# Patient Record
Sex: Female | Born: 1960 | Hispanic: No | Marital: Married | Smoking: Never smoker
Health system: Southern US, Community
[De-identification: ages and names within clinical notes are randomized; demographics above are authoritative.]

## PROBLEM LIST (undated history)

## (undated) DIAGNOSIS — C4492 Squamous cell carcinoma of skin, unspecified: Secondary | ICD-10-CM

## (undated) HISTORY — PX: KNEE ARTHROSCOPY W/ LASER: SHX1872

## (undated) HISTORY — DX: Squamous cell carcinoma of skin, unspecified: C44.92

---

## 1998-04-23 ENCOUNTER — Other Ambulatory Visit: Admission: RE | Admit: 1998-04-23 | Discharge: 1998-04-23 | Payer: Self-pay | Admitting: Obstetrics and Gynecology

## 2000-04-23 ENCOUNTER — Other Ambulatory Visit: Admission: RE | Admit: 2000-04-23 | Discharge: 2000-04-23 | Payer: Self-pay | Admitting: Obstetrics and Gynecology

## 2002-07-13 ENCOUNTER — Other Ambulatory Visit: Admission: RE | Admit: 2002-07-13 | Discharge: 2002-07-13 | Payer: Self-pay | Admitting: Obstetrics and Gynecology

## 2002-11-28 DIAGNOSIS — C4492 Squamous cell carcinoma of skin, unspecified: Secondary | ICD-10-CM

## 2002-11-28 HISTORY — DX: Squamous cell carcinoma of skin, unspecified: C44.92

## 2004-02-06 ENCOUNTER — Other Ambulatory Visit: Admission: RE | Admit: 2004-02-06 | Discharge: 2004-02-06 | Payer: Self-pay | Admitting: Obstetrics and Gynecology

## 2009-09-13 ENCOUNTER — Ambulatory Visit: Payer: Self-pay | Admitting: Family Medicine

## 2009-09-13 DIAGNOSIS — R519 Headache, unspecified: Secondary | ICD-10-CM | POA: Insufficient documentation

## 2009-09-13 DIAGNOSIS — J45901 Unspecified asthma with (acute) exacerbation: Secondary | ICD-10-CM | POA: Insufficient documentation

## 2009-09-13 DIAGNOSIS — R51 Headache: Secondary | ICD-10-CM

## 2009-09-13 DIAGNOSIS — J309 Allergic rhinitis, unspecified: Secondary | ICD-10-CM

## 2009-09-24 ENCOUNTER — Ambulatory Visit: Payer: Self-pay | Admitting: Family Medicine

## 2009-09-24 DIAGNOSIS — H6120 Impacted cerumen, unspecified ear: Secondary | ICD-10-CM | POA: Insufficient documentation

## 2009-10-03 ENCOUNTER — Telehealth: Payer: Self-pay | Admitting: Family Medicine

## 2009-10-15 ENCOUNTER — Ambulatory Visit: Payer: Self-pay | Admitting: Family Medicine

## 2009-10-15 DIAGNOSIS — R5383 Other fatigue: Secondary | ICD-10-CM

## 2009-10-15 DIAGNOSIS — R5381 Other malaise: Secondary | ICD-10-CM

## 2009-10-15 LAB — CONVERTED CEMR LAB
AST: 19 units/L (ref 0–37)
Albumin: 3.9 g/dL (ref 3.5–5.2)
Alkaline Phosphatase: 34 units/L — ABNORMAL LOW (ref 39–117)
Basophils Absolute: 0 10*3/uL (ref 0.0–0.1)
Bilirubin, Direct: 0.1 mg/dL (ref 0.0–0.3)
CO2: 29 meq/L (ref 19–32)
Calcium: 9.2 mg/dL (ref 8.4–10.5)
Chloride: 107 meq/L (ref 96–112)
Cholesterol: 174 mg/dL (ref 0–200)
Free T4: 1 ng/dL (ref 0.6–1.6)
GFR calc non Af Amer: 81.33 mL/min (ref >60)
Glucose, Bld: 70 mg/dL (ref 70–99)
HCT: 37.4 % (ref 36.0–46.0)
HDL: 39.3 mg/dL (ref >39.00)
Hemoglobin: 13 g/dL (ref 12.0–15.0)
MCHC: 34.8 g/dL (ref 30.0–36.0)
MCV: 93.4 fL (ref 78.0–100.0)
Monocytes Relative: 10.5 % (ref 3.0–12.0)
Neutrophils Relative %: 57.6 % (ref 43.0–77.0)
Potassium: 4.7 meq/L (ref 3.5–5.1)
RBC: 4.01 M/uL (ref 3.87–5.11)
RDW: 12.6 % (ref 11.5–14.6)
T3, Free: 2.8 pg/mL (ref 2.3–4.2)
Total Bilirubin: 0.4 mg/dL (ref 0.3–1.2)
VLDL: 20.4 mg/dL (ref 0.0–40.0)
WBC: 5.1 10*3/uL (ref 4.5–10.5)

## 2009-10-17 ENCOUNTER — Ambulatory Visit: Payer: Self-pay | Admitting: Family Medicine

## 2009-10-21 ENCOUNTER — Telehealth: Payer: Self-pay | Admitting: Family Medicine

## 2009-10-22 ENCOUNTER — Ambulatory Visit: Payer: Self-pay | Admitting: Family Medicine

## 2009-10-22 DIAGNOSIS — L259 Unspecified contact dermatitis, unspecified cause: Secondary | ICD-10-CM | POA: Insufficient documentation

## 2010-07-15 NOTE — Assessment & Plan Note (Signed)
Summary: rash/dm   Vital Signs:  Patient profile:   50 year old female Menstrual status:  regular BP sitting:   110 / 70  (right arm) Cuff size:   regular  Vitals Entered By: Kathrynn Speed CMA (Oct 22, 2009 3:32 PM) CC: Rash on neck   Contraindications/Deferment of Procedures/Staging:    Test/Procedure: Weight Refused    Reason for deferment: patient declined-cannot calculate BMI   CC:  Rash on neck.  History of Present Illness: Cindy Frost is a 50 year old female, married, who comes in today for evaluation of a skin rash on her neck and for 5 days.  It is nonpleuritic.  It just involves the neck pain.  She's had a history of unusual rashes in the past.  She states that last year.  She broke out in whelps all over.  No diagnosis.  Sounds like idiopathic urticaria  Allergies: 1)  ! Amoxicillin  Past History:  Past medical, surgical, family and social histories (including risk factors) reviewed for relevance to current acute and chronic problems.  Past Medical History: Reviewed history from 09/13/2009 and no changes required. Allergic rhinitis Headache  Past Surgical History: Reviewed history from 09/13/2009 and no changes required. Caesarean section Tonsillectomy mole removals BTL cb x 3  Family History: Reviewed history from 09/13/2009 and no changes required. Father: healthy Mother: dementia Siblings: 1 brother - healthy, 2 sisters - healthy Children: 3 girls - healthy  Social History: Reviewed history from 09/13/2009 and no changes required. 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   Problems:  Medical Problems Added: 1)  Dx of Contact Dermatitis&other Eczema Due Unspec Cause   (ICD-692.9)  Impression & Recommendations:  Problem # 1:  CONTACT DERMATITIS&OTHER ECZEMA DUE UNSPEC CAUSE (ICD-692.9) Assessment New  Her updated medication list for this problem includes:    Benadryl 25 Mg Tabs (Diphenhydramine hcl) .Marland Kitchen... Take one tab by mouth once daily  Complete Medication List: 1)  Benadryl 25 Mg Tabs (Diphenhydramine hcl) .... Take one tab by mouth once daily 2)  Flovent Hfa 44 Mcg/act Aero (Fluticasone propionate  hfa) .... 2 ps two times a day 3)  Hydromet 5-1.5 Mg/18ml Syrp (Hydrocodone-homatropine) .... 1/2 to 1 tsp at bedtime as needed  Patient Instructions: 1)  apply small amounts of Cortaid cream twice daily.  Return p.r.n.   Orders Added: 1)  Est. Patient Level III [[address]]

## 2010-07-15 NOTE — Assessment & Plan Note (Signed)
Summary: recheck ear/dm   Vital Signs:  Patient profile:   50 year old female Menstrual status:  regular Temp:     98.8 degrees F oral BP sitting:   110 / 78  (left arm) Cuff size:   regular  Vitals Entered By: Kern Reap CMA Duncan Dull) (September 24, 2009 12:27 PM)  History of Present Illness: Cindy Frost is a 50 year old, married female, nonsmoker, who comes in today for evaluation of two problems.  She has bilateral cerumen impactions, and has been using the earwax dissolving drops at home.  She tried flushing, but no wax came out.  We start her on prednisone two tabs x 3 days in April.  The first.  She states she is not a whole lot better.  She still coughing and wheezing.  Review of systems negative specifically, no history of reflux, except she states when she was pregnant  Allergies: 1)  ! Amoxicillin  Review of Systems      See HPI  Physical Exam  General:  Well-developed,well-nourished,in no acute distress; alert,appropriate and cooperative throughout examination Head:  Normocephalic and atraumatic without obvious abnormalities. No apparent alopecia or balding. Eyes:  No corneal or conjunctival inflammation noted. EOMI. Perrla. Funduscopic exam benign, without hemorrhages, exudates or papilledema. Vision grossly normal. Ears:  bilateral cerumen impactions removed by me with suction Nose:  External nasal examination shows no deformity or inflammation. Nasal mucosa are pink and moist without lesions or exudates. Mouth:  Oral mucosa and oropharynx without lesions or exudates.  Teeth in good repair. Neck:  No deformities, masses, or tenderness noted. Chest Wall:  No deformities, masses, or tenderness noted. Lungs:  symmetrical breath sounds, late expiratory wheezing   Impression & Recommendations:  Problem # 1:  ASTHMA, ACUTE (ICD-493.92) Assessment Improved  Her updated medication list for this problem includes:    Prednisone 20 Mg Tabs (Prednisone) ..... Uad  Problem # 2:   CERUMEN IMPACTION (ICD-380.4) Assessment: Unchanged  Orders: Cerumen Impaction Removal ([address])  Complete Medication List: 1)  Prednisone 20 Mg Tabs (Prednisone) .... Uad 2)  Hydromet 5-1.5 Mg/2ml Syrp (Hydrocodone-homatropine) .Marland Kitchen.. 1 or 2 tsps at bedtime as needed 3)  Tessalon Perles 100 Mg Caps (Benzonatate) .Marland Kitchen.. 1 or 2 qid as needed  Patient Instructions: 1)  continue to drink lots of water, vaporizer or humidifier in your bedroom at night, Tessalon one to two tabs 4 times a day during the day for cough, Hydromet, one or 2 teaspoons at bedtime, and increase the prednisone two tabs x 3 days, one x 3 days, a half x 3 days, then half a tablet Monday, Wednesday, Friday, for a two week taper.  Also take Prilosec OTC 20 mg b.i.d. return p.r.n. Prescriptions: HYDROMET 5-1.5 MG/5ML SYRP (HYDROCODONE-HOMATROPINE) 1 or 2 tsps at bedtime as needed  #8oz x 0   Entered and Authorized by:   Roderick Pee MD   Signed by:   Roderick Pee MD on 09/24/2009   Method used:   Print then Give to Patient   RxID:   [id number] TESSALON PERLES 100 MG CAPS (BENZONATATE) 1 or 2 qid as needed  #50 x 2   Entered and Authorized by:   Roderick Pee MD   Signed by:   Roderick Pee MD on 09/24/2009   Method used:   [facility]        [facility]  [address] [phone]ail[facility]       [address]  [place], [place]  [address]       Ph: [phone] or [phone]       Fax: [phone]   RxID:   [id number]

## 2010-07-15 NOTE — Assessment & Plan Note (Signed)
Summary: requesting labs//ccm   Vital Signs:  Patient profile:   50 year old female Menstrual status:  regular Weight:      144 pounds Temp:     98.7 degrees F oral BP sitting:   102 / 70  (left arm) Cuff size:   regular CC: cough & body aches X2 month   wants blood work   CC:  cough & body aches X2 month   wants blood work.  History of Present Illness: Carrin is a 50 year old, married female, nonsmoker, who comes in today with a two-month history of cough, fatigue, nausea, and 10 pounds of weight loss.  We saw her a couple weeks ago with head congestion, postnasal drip, and cough.  On physical exam, she was wheezing.  We put her on a burst and taper of prednisone.  He does seem to help with her 4 were repeated and the second, round, and help either.  She feels fatigued and short of breath, even when climbing stairs.  Review of systems otherwise negative.  LMP April 21.  Birth, control she's had a BTL  Allergies: 1)  ! Amoxicillin  Past History:  Past medical, surgical, family and social histories (including risk factors) reviewed, and no changes noted (except as noted below).  Past Medical History: Reviewed history from 09/13/2009 and no changes required. Allergic rhinitis Headache  Past Surgical History: Reviewed history from 09/13/2009 and no changes required. Caesarean section Tonsillectomy mole removals BTL cb x 3  Family History: Reviewed history from 09/13/2009 and no changes required. Father: healthy Mother: dementia Siblings: 1 brother - healthy, 2 sisters - healthy Children: 3 girls - healthy  Social History: Reviewed history from 09/13/2009 and no changes required. 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 No apparent alopecia or balding. Eyes:  No corneal or conjunctival inflammation noted. EOMI. Perrla. Funduscopic exam benign, without hemorrhages, exudates or papilledema. Vision grossly normal. Ears:  External ear exam shows no significant lesions or deformities.  Otoscopic examination reveals clear canals, tympanic membranes are intact bilaterally without bulging, retraction, inflammation or discharge. Hearing is grossly normal bilaterally. Nose:  External nasal examination shows no deformity or inflammation. Nasal mucosa are pink and moist without lesions or exudates. Mouth:  Oral mucosa and oropharynx without lesions or exudates.  Teeth in good repair. Neck:  No deformities, masses, or tenderness noted. Chest Wall:  No deformities, masses, or tenderness noted. Breasts:  No mass, nodules, thickening, tenderness, bulging, retraction, inflamation, nipple discharge or skin changes noted.   Lungs:  Normal respiratory effort, chest expands symmetrically. Lungs are clear to auscultation, no crackles or wheezes. Heart:  Normal rate and regular rhythm. S1 and S2 normal without gallop, murmur, click, rub or other extra sounds. Abdomen:  Bowel sounds positive,abdomen soft and non-tender without masses, organomegaly or hernias noted. Msk:  No deformity or scoliosis noted of thoracic or lumbar spine.   Pulses:  R and L carotid,radial,femoral,dorsalis pedis and posterior tibial pulses are full and equal bilaterally Extremities:  No clubbing, cyanosis, edema, or deformity noted with normal full range of motion of all joints.   Neurologic:  No cranial nerve deficits noted. Station and gait are normal. Plantar reflexes are down-going bilaterally. DTRs are symmetrical throughout. Sensory, motor and coordinative functions appear intact. Skin:  Intact without suspicious lesions or rashes Cervical Nodes:  No lymphadenopathy noted Axillary Nodes:  No palpable lymphadenopathy Inguinal  Nodes:  No significant adenopathy Psych:  Cognition and judgment appear intact. Alert and cooperative with normal attention span and concentration. No apparent delusions, illusions, hallucinations   Problems:  Medical Problems Added: 1)  Dx of Fatigue  (ICD-780.79) 2)  Dx of Nausea  (ICD-787.02) 3)  Dx of Cough  (ICD-786.2)  Impression & Recommendations:  Problem # 1:  FATIGUE (ICD-780.79) Assessment New  Orders: TLB-Lipid Panel (80061-LIPID) TLB-BMP (Basic Metabolic Panel-BMET) (80048-METABOL) TLB-CBC Platelet - w/Differential (85025-CBCD) TLB-Hepatic/Liver Function Pnl (80076-HEPATIC) TLB-TSH (Thyroid Stimulating Hormone) (84443-TSH) TLB-T3, Free (Triiodothyronine) (84481-T3FREE) TLB-T4 (Thyrox), Free (84439-FT4R) T-2 View CXR (71020TC)  Problem # 2:  NAUSEA (ICD-787.02) Assessment: New  Orders: TLB-Lipid Panel (80061-LIPID) TLB-BMP (Basic Metabolic Panel-BMET) (80048-METABOL) TLB-CBC Platelet - w/Differential (85025-CBCD) TLB-Hepatic/Liver Function Pnl (80076-HEPATIC) TLB-TSH (Thyroid Stimulating Hormone) (84443-TSH) TLB-T3, Free (Triiodothyronine) (84481-T3FREE) TLB-T4 (Thyrox), Free (84439-FT4R) T-2 View CXR (71020TC) T-Sinuses Complete (70220TC)  Problem # 3:  COUGH (ICD-786.2) Assessment: Unchanged  Orders: TLB-Lipid Panel (80061-LIPID) TLB-BMP (Basic Metabolic Panel-BMET) (80048-METABOL) TLB-CBC Platelet - w/Differential (85025-CBCD) TLB-Hepatic/Liver Function Pnl (80076-HEPATIC) TLB-TSH (Thyroid Stimulating Hormone) (84443-TSH) TLB-T3, Free (Triiodothyronine) (84481-T3FREE) TLB-T4 (Thyrox), Free ([address]-UE4V) T-2 View CXR (71020TC) T-Sinuses Complete (70220TC)  Other Orders: Venipuncture (40981)  Patient Instructions: 1)  we will do your lab work today and go to the main office now for your sinus x-rays and chest x-ray.  Return on Thursday for follow-up.

## 2010-07-15 NOTE — Assessment & Plan Note (Signed)
Summary: follow up office visit -rv   Vitals Entered By: Kern Reap CMA Duncan Dull) (Oct 17, 2009 4:09 PM) CC: follow-up visit   CC:  follow-up visit.  Allergies: 1)  ! Amoxicillin   Complete Medication List: 1)  Benadryl 25 Mg Tabs (Diphenhydramine hcl) .... Take one tab by mouth once daily 2)  Flovent Hfa 44 Mcg/act Aero (Fluticasone propionate  hfa) .... 2 ps two times a day 3)  Hydromet 5-1.5 Mg/72ml Syrp (Hydrocodone-homatropine) .... 1/2 to 1 tsp at bedtime as needed  Other Orders: Pulmonary Referral (Pulmonary)  Patient Instructions: 1)  drink 30 ounces of water daily, take one half to 1 teaspoon of Hydromet at bedtime. 2)  Begin Flovent 44 2+ b.i.d. remember swish and spit after using the Flovent. 3)  I will set showed a pulmonary consult for further evaluation AndAndIsPrescriptions: HYDROMET 5-1.5 MG/5ML SYRP (HYDROCODONE-HOMATROPINE) 1/2 to 1 tsp at bedtime as needed  #8oz x 1   Entered and Authorized by:   Roderick Pee MD   Signed by:   Roderick Pee MD on 10/17/2009   Method used:   Print then Give to Patient   RxID:   [id number] FLOVENT HFA 44 MCG/ACT AERO (FLUTICASONE PROPIONATE  HFA) 2 ps two times a day  #1 x 3   Entered and Authorized by:   Roderick Pee MD   Signed by:   Roderick Pee MD on 10/17/2009   Method used:   [facility]  [address] [address]       [address]       [place], [place]  [address]       Ph: [phone] or [phone]       Fax: [phone]   RxID:   [id number]

## 2010-07-15 NOTE — Progress Notes (Signed)
Summary: new rx  Phone Note Call from Patient Call back at [phone]   Caller: Patient Call For: Roderick Pee MD Summary of Call: pt would like another round of hydromet cough syrup. pt will pick up rx Initial call taken by: Heron Sabins,  October 03, 2009 12:58 PM  Follow-up for Phone Call        Hydromet 4 ounces directions one to 2 teaspoons at bedtime as needed for nighttime cough.  No refills.  If symptoms persist, will need ov ............    next week Follow-up by: Roderick Pee MD,  October 03, 2009 1:38 PM  Additional Follow-up for Phone Call Additional follow up Details #1::        Phone Call Completed, Rx Called In Additional Follow-up by: Kern Reap CMA Duncan Dull),  October 03, 2009 3:05 PM

## 2010-07-15 NOTE — Assessment & Plan Note (Signed)
Summary: NEW PT TO EST--OK PER RACHEL//CCM   Vital Signs:  Patient profile:   50 year old female Menstrual status:  regular LMP:     09/13/2009 Height:      67 inches Weight:      149 pounds BMI:     23.42 Temp:     98.5 degrees F oral BP sitting:   110 / 80  (left arm) Cuff size:   regular  Vitals Entered By: Kern Reap CMA Duncan Dull) (September 13, 2009 10:34 AM) CC: new to establish Is Patient Diabetic? No Pain Assessment Patient in pain? yes     Location: ear LMP (date): 09/13/2009     Menstrual Status regular Enter LMP: 09/13/2009   CC:  new to establish.  History of Present Illness: Cindy Frost is a 50 year old, married female, G3, P3, who works at [employer], who comes in today for new patient evaluation of a headache and cough x 3 weeks.  She's had head congestion, sore throat, postnasal drip, and a cough for 3 weeks.  She went to an urgent care center and they gave her a Z-Pak, but of course that did not help.  She also has pain in her left ear that she states is sharp and it comes and goes.  Review of systems otherwise negative.  She is a nonsmoker.  She's had an allergy workup in the past and was told she has severe allergies to tying and all the trees bloom in the spring.  This was done in [place].  Birth, control she's had a BTL  Preventive Screening-Counseling & Management  Alcohol-Tobacco     Smoking Status: never  Hep-HIV-STD-Contraception     Dental Visit-last 6 months yes      Drug Use:  no.    Allergies (verified): 1)  ! Amoxicillin  Past History:  Past Medical History: Allergic rhinitis Headache  Past Surgical History: Caesarean section Tonsillectomy mole removals BTL cb x 3  Family History: Reviewed history and no changes required. Father: healthy Mother: dementia Siblings: 1 brother - healthy, 2 sisters - healthy Children: 3 girls - healthy  Social History: Reviewed history and no changes required. 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 No apparent alopecia or balding. Eyes:  No corneal or conjunctival inflammation noted. EOMI. Perrla. Funduscopic exam benign, without hemorrhages, exudates or papilledema. Vision grossly normal. Ears:  I can visualize both eardrums.  They are full lax so.  TMs normal Nose:  External nasal examination shows no deformity or inflammation. Nasal mucosa are pink and moist without lesions or exudates. Mouth:  Oral mucosa and oropharynx without lesions or exudates.  Teeth in good repair. Neck:  No deformities, masses, or tenderness noted. Chest Wall:  No deformities, masses, or tenderness noted. Lungs:  symmetrical breath sounds, late expiratory wheezing   Problems:  Medical Problems Added: 1)  Dx of Asthma, Acute  (ICD-493.92) 2)  Dx of Headache  (ICD-784.0) 3)  Dx of Allergic Rhinitis  (ICD-477.9)  Impression & Recommendations:  Problem # 1:  ALLERGIC RHINITIS (ICD-477.9) Assessment New  Orders: Prescription Created Electronically [phone])  Problem # 2:  ASTHMA, ACUTE (GNF-621.30) Assessment: New  Her updated medication list  for this problem includes:    Prednisone 20 Mg Tabs (Prednisone) ..... Uad  Orders: Prescription Created Electronically [phone])  Complete Medication List: 1)  Prednisone 20 Mg Tabs (Prednisone) .... Uad 2)  Hydromet 5-1.5 Mg/16ml Syrp (Hydrocodone-homatropine) .Marland Kitchen.. 1 or 2 tsps at bedtime as needed  Patient Instructions: 1)  begin prednisone, take two tabs x 3 days, one x 3 days, a half x 3 days, then half a tablet Monday, Wednesday, Friday, for a two week taper. 2)  Use the ear wax kit as we  outlined..... if  the ear wax does not come out.  Call come in, we  will suck it out 3)  return for a annual medical exam, ASAP   v70.0 4)  BMP prior to visit, ICD-9: 5)  Hepatic Panel prior to visit, ICD-9: 6)  Lipid Panel prior to visit, ICD-9: 7)  TSH prior to visit, ICD-9: 8)  CBC w/ Diff prior to visit, ICD-9: 9)  Urine-dip prior to visit, ICD-9: Prescriptions: HYDROMET 5-1.5 MG/5ML SYRP (HYDROCODONE-HOMATROPINE) 1 or 2 tsps at bedtime as needed  #8oz x 0   Entered and Authorized by:   Roderick Pee MD   Signed by:   Roderick Pee MD on 09/13/2009   Method used:   Print then Give to Patient   RxID:   [id number] PREDNISONE 20 MG TABS (PREDNISONE) UAD  #30 x 1   Entered and Authorized by:   Roderick Pee MD   Signed by:   Roderick Pee MD on 09/13/2009   Method used:   [facility]        [facility]  [address]       [address]       [place], [place]  [address]       Ph: [phone] or [phone]       Fax: [phone]   RxID:   [id number]

## 2010-07-15 NOTE — Progress Notes (Signed)
Summary: rash on neck  Phone Note Call from Patient   Caller: Patient Call For: Roderick Pee MD Summary of Call: Pt would like to talk to Dr. Tawanna Cooler about a rash that developed on her neck since her office visit 10/17/2009. [phone] Initial call taken by: Lynann Beaver CMA,  Oct 21, 2009 9:34 AM  Follow-up for Phone Call        I called twice no answer.  Left message on answering machine to come to the office today at 445 because not  possible to diagnose a skin rash over the phone Follow-up by: Roderick Pee MD,  Oct 21, 2009 3:27 PM     Appended Document: rash on neck [phone] wrong number, pt will come for OV today.

## 2017-04-01 DIAGNOSIS — Z1231 Encounter for screening mammogram for malignant neoplasm of breast: Secondary | ICD-10-CM | POA: Diagnosis not present

## 2017-04-01 DIAGNOSIS — Z6824 Body mass index (BMI) 24.0-24.9, adult: Secondary | ICD-10-CM | POA: Diagnosis not present

## 2017-04-01 DIAGNOSIS — Z01419 Encounter for gynecological examination (general) (routine) without abnormal findings: Secondary | ICD-10-CM | POA: Diagnosis not present

## 2017-05-31 ENCOUNTER — Telehealth: Payer: Self-pay | Admitting: Family Medicine

## 2017-05-31 NOTE — Telephone Encounter (Signed)
Copied from [place]. Topic: Appointment Scheduling - Scheduling Inquiry for Clinic >> May 31, 2017  9:14 AM Patrice Paradise wrote: Reason for CRM: Patient called to schedule an appt but has not been seen in the practice over 3 years. She is a formal patient of Dr. Sherren Mocha. I informed the patient that I would have to put her in as a new patient and that Dr. Sherren Mocha was not excepting new patient. Patient stated that she really like him and would really like to have him as her doctor. Patient would like to know if Dr. Sherren Mocha would be will to take her as a new patient. Patient is requesting a call back @ [phone]

## 2017-05-31 NOTE — Telephone Encounter (Signed)
Attempted to call pt but the voicemail is not set up to leave a message. Will try back

## 2017-06-01 NOTE — Telephone Encounter (Signed)
Spoke with pt scheduled an appointment on 07/05/2017 at 9.45 am

## 2017-07-05 ENCOUNTER — Ambulatory Visit (INDEPENDENT_AMBULATORY_CARE_PROVIDER_SITE_OTHER): Payer: Managed Care, Other (non HMO)

## 2017-07-05 ENCOUNTER — Ambulatory Visit: Payer: Managed Care, Other (non HMO) | Admitting: Family Medicine

## 2017-07-05 ENCOUNTER — Encounter: Payer: Self-pay | Admitting: Family Medicine

## 2017-07-05 VITALS — BP 98/78 | HR 66 | Temp 97.8°F | Wt 154.0 lb

## 2017-07-05 DIAGNOSIS — Z23 Encounter for immunization: Secondary | ICD-10-CM

## 2017-07-05 DIAGNOSIS — J301 Allergic rhinitis due to pollen: Secondary | ICD-10-CM

## 2017-07-05 DIAGNOSIS — H6121 Impacted cerumen, right ear: Secondary | ICD-10-CM

## 2017-07-05 MED ORDER — PREDNISONE 20 MG PO TABS
ORAL_TABLET | ORAL | 1 refills | Status: DC
Start: 2017-07-05 — End: 2018-10-13

## 2017-07-05 NOTE — Progress Notes (Signed)
Cindy Frost is a delightful 57 year old female nonsmoker who comes in today for evaluation of a cough for your years  She's had a history of a cough for years. Is not related to eating or swallowing. She does not cough a lot at night. She does have a lot of head congestion. She went to wake Cindy Frost in [place] 01/11/2017 for a physical. The time she was given albuterol to take when necessary  She was also given a tetanus booster and given instructions on the shingles vaccine  She gets routine eye care, dental care, BSE monthly, annual mammography at the GYN office. Colonoscopy 2015 normal.  Social history....... the same Cindy Frost recently retired from [employer]. Wants to work it [facility] cuddly baby's.  She sees a GYN yearly for Paps and pelvics  BP 98/78 (BP Location: Left Arm, Patient Position: Sitting, Cuff Size: Normal)   Pulse 66   Temp 97.8 F (36.6 C) (Oral)   Wt 154 lb (69.9 kg)   BMI 24.12 kg/m  Well-developed well-nourished no acute distress  HEENT were pertinent the septum was deviated slightly to the left and she has a lot of swelling of the turbinates. She does have postnasal drip on oral exam lungs are clear to auscultation. Neck exam normal.  #1 allergic rhinitis with cough......... daily at bedtime antihistamine along with one shot of steroid nasal spray up each nostril at bedtime. If this does not resolve her symptoms we'll give her a short course of prednisone.  Chest x-ray per patient request

## 2017-07-05 NOTE — Patient Instructions (Signed)
Plain Zyrtec...Marland KitchenMarland KitchenMarland Kitchen 1 at bedtime  Steroid nasal spray.Marland KitchenMarland KitchenMarland KitchenMarland Kitchen 1 shot up each nostril at bedtime  If you do the above and you don't see much improvement in the next week to 10 days take a short course of prednisone........ 20 mg..........Marland Kitchen 1 tab for 7 days, half a tab for 7 days, then a half a tab Monday Wednesday Friday for a three-week taper  Go to the [place] office for your chest x-ray........... I will call you if there is anything abnormal

## 2018-04-25 ENCOUNTER — Other Ambulatory Visit: Payer: Self-pay | Admitting: Obstetrics and Gynecology

## 2018-04-25 DIAGNOSIS — R928 Other abnormal and inconclusive findings on diagnostic imaging of breast: Secondary | ICD-10-CM

## 2018-04-28 ENCOUNTER — Ambulatory Visit
Admission: RE | Admit: 2018-04-28 | Discharge: 2018-04-28 | Disposition: A | Discharge status: Home / Self Care | Payer: Managed Care, Other (non HMO) | Source: Ambulatory Visit | Attending: Obstetrics and Gynecology | Admitting: Obstetrics and Gynecology

## 2018-04-28 DIAGNOSIS — R928 Other abnormal and inconclusive findings on diagnostic imaging of breast: Secondary | ICD-10-CM

## 2018-10-13 ENCOUNTER — Other Ambulatory Visit: Payer: Self-pay

## 2018-10-13 ENCOUNTER — Ambulatory Visit (INDEPENDENT_AMBULATORY_CARE_PROVIDER_SITE_OTHER): Payer: BLUE CROSS/BLUE SHIELD | Admitting: Internal Medicine

## 2018-10-13 DIAGNOSIS — R6889 Other general symptoms and signs: Secondary | ICD-10-CM | POA: Diagnosis not present

## 2018-10-13 NOTE — Progress Notes (Signed)
Virtual Visit via Video Note  I connected with Cindy Frost on 10/13/18 at 10:30 AM EDT by a video enabled telemedicine application and verified that I am speaking with the correct person using two identifiers.  Location patient: home Location provider: work office Persons participating in the virtual visit: patient, provider  I discussed the limitations of evaluation and management by telemedicine and the availability of in person appointments. The patient expressed understanding and agreed to proceed.   HPI: This is an acute visit to discuss URI symptoms.  Since last night: Fever up to 101.6, chills, body aches, chronic cough. No SOB, no HA, no GI symptoms. Has been taking tylenol with some improvement. Temp 99.1 this am without tylenol use past 6 hours. Not working. Has only been leaving the house for groceries.   ROS: Constitutional: Denies fever, chills, diaphoresis, appetite change and fatigue.  HEENT: Denies photophobia, eye pain, redness, hearing loss, ear pain, mouth sores, trouble swallowing, neck pain, neck stiffness and tinnitus.   Respiratory: Denies SOB, DOE, chest tightness,  and wheezing.   Cardiovascular: Denies chest pain, palpitations and leg swelling.  Gastrointestinal: Denies nausea, vomiting, abdominal pain, diarrhea, constipation, blood in stool and abdominal distention.  Genitourinary: Denies dysuria, urgency, frequency, hematuria, flank pain and difficulty urinating.  Endocrine: Denies: hot or cold intolerance, sweats, changes in hair or nails, polyuria, polydipsia. Musculoskeletal: Denies myalgias, back pain, joint swelling, arthralgias and gait problem.  Skin: Denies pallor, rash and wound.  Neurological: Denies dizziness, seizures, syncope, weakness, light-headedness, numbness and headaches.  Hematological: Denies adenopathy. Easy bruising, personal or family bleeding history  Psychiatric/Behavioral: Denies suicidal ideation, mood changes, confusion,  nervousness, sleep disturbance and agitation   Past Medical History:  Diagnosis Date   SCC (squamous cell carcinoma) 11/28/2002   Left Side of Neck    Past Surgical History:  Procedure Laterality Date   KNEE ARTHROSCOPY W/ LASER Left     No family history on file.  SOCIAL HX:   reports that she has never smoked. She has never used smokeless tobacco. She reports current alcohol use. She reports that she does not use drugs.  No current outpatient medications on file.  EXAM:   VITALS per patient if applicable: none reported  GENERAL: alert, oriented, appears acutely ill, lying on couch covered in blankets  HEENT: atraumatic, conjunttiva clear, no obvious abnormalities on inspection of external nose and ears  NECK: normal movements of the head and neck  LUNGS: on inspection no signs of respiratory distress, breathing rate appears normal, no obvious gross increased work of breathing, gasping or wheezing  CV: no obvious cyanosis  MS: moves all visible extremities without noticeable abnormality  PSYCH/NEURO: pleasant and cooperative, no obvious depression or anxiety, speech and thought processing grossly intact  ASSESSMENT AND PLAN:   Flu-like symptoms -Sounds like flu vs COVID. -Symptoms are currently mild. -Advised self-isolation at home, rest, fluids and continued tylenol use. -Advised ER if develops SOB, otherwise will stay at home. -Will schedule another visit next week to see how she is doing.    I discussed the assessment and treatment plan with the patient. The patient was provided an opportunity to ask questions and all were answered. The patient agreed with the plan and demonstrated an understanding of the instructions.   The patient was advised to call back or seek an in-person evaluation if the symptoms worsen or if the condition fails to improve as anticipated.    [name], MD  [facility]  at [facility]

## 2019-02-14 ENCOUNTER — Other Ambulatory Visit: Payer: Self-pay

## 2019-02-14 ENCOUNTER — Ambulatory Visit (INDEPENDENT_AMBULATORY_CARE_PROVIDER_SITE_OTHER): Payer: BC Managed Care – PPO | Admitting: Internal Medicine

## 2019-02-14 ENCOUNTER — Encounter: Payer: Self-pay | Admitting: Internal Medicine

## 2019-02-14 VITALS — BP 120/80 | HR 79 | Temp 97.9°F | Wt 154.9 lb

## 2019-02-14 DIAGNOSIS — M545 Low back pain, unspecified: Secondary | ICD-10-CM

## 2019-02-14 LAB — POCT URINALYSIS DIPSTICK
Bilirubin, UA: NEGATIVE
Blood, UA: NEGATIVE
Glucose, UA: NEGATIVE
Ketones, UA: NEGATIVE
Leukocytes, UA: NEGATIVE
Nitrite, UA: NEGATIVE
Protein, UA: NEGATIVE
Spec Grav, UA: 1.015 (ref 1.010–1.025)
Urobilinogen, UA: 0.2 E.U./dL
pH, UA: 6.5 (ref 5.0–8.0)

## 2019-02-14 LAB — URINALYSIS, ROUTINE W REFLEX MICROSCOPIC
Bilirubin Urine: NEGATIVE
Hgb urine dipstick: NEGATIVE
Ketones, ur: NEGATIVE
Nitrite: NEGATIVE
RBC / HPF: NONE SEEN (ref 0–?)
Specific Gravity, Urine: 1.01 (ref 1.000–1.030)
Total Protein, Urine: NEGATIVE
Urine Glucose: NEGATIVE
Urobilinogen, UA: 0.2 (ref 0.0–1.0)
pH: 6.5 (ref 5.0–8.0)

## 2019-02-14 NOTE — Progress Notes (Signed)
     Acute Office Visit     CC/Reason for Visit: Left low back pain  HPI: Cindy Frost is a 58 y.o. female who is coming in today for the above mentioned reasons.  She has started experiencing some left lower back pain for the past 2 weeks.  She recently started running.  She denies dysuria, urinary frequency, gross hematuria or foul odor of urine.  She states pain is especially prominent at nighttime while moving in bed.  No radiculopathy.  No fevers.   Past Medical/Surgical History: Past Medical History:  Diagnosis Date  . SCC (squamous cell carcinoma) 11/28/2002   Left Side of Neck    Past Surgical History:  Procedure Laterality Date  . KNEE ARTHROSCOPY W/ LASER Left     Social History:  reports that she has never smoked. She has never used smokeless tobacco. She reports current alcohol use. She reports that she does not use drugs.  Allergies: Allergies  Allergen Reactions  . Amoxicillin     REACTION: hives    Family History:  No history of heart disease, cancer, stroke that she is aware of.  No current outpatient medications on file.  Review of Systems:  Constitutional: Denies fever, chills, diaphoresis, appetite change and fatigue.  HEENT: Denies photophobia, eye pain, redness, hearing loss, ear pain, congestion, sore throat, rhinorrhea, sneezing, mouth sores, trouble swallowing, neck pain, neck stiffness and tinnitus.   Respiratory: Denies SOB, DOE, cough, chest tightness,  and wheezing.   Cardiovascular: Denies chest pain, palpitations and leg swelling.  Gastrointestinal: Denies nausea, vomiting, abdominal pain, diarrhea, constipation, blood in stool and abdominal distention.  Genitourinary: Denies dysuria, urgency, frequency, hematuria, flank pain and difficulty urinating.  Endocrine: Denies: hot or cold intolerance, sweats, changes in hair or nails, polyuria, polydipsia. Musculoskeletal: Denies  joint swelling, arthralgias and gait problem.  Skin: Denies pallor,  rash and wound.  Neurological: Denies dizziness, seizures, syncope, weakness, light-headedness, numbness and headaches.  Hematological: Denies adenopathy. Easy bruising, personal or family bleeding history  Psychiatric/Behavioral: Denies suicidal ideation, mood changes, confusion, nervousness, sleep disturbance and agitation    Physical Exam: Vitals:   02/14/19 0926  BP: 120/80  Pulse: 79  Temp: 97.9 F (36.6 C)  TempSrc: Temporal  SpO2: 97%  Weight: 154 lb 14.4 oz (70.3 kg)    Body mass index is 24.26 kg/m.   Constitutional: NAD, calm, comfortable Eyes: PERRL, lids and conjunctivae normal ENMT: Mucous membranes are moist.  Musculoskeletal: no clubbing / cyanosis. No joint deformity upper and lower extremities. Good ROM, no contractures. Normal muscle tone.  No pain to palpation of left or midline back. Skin: no rashes, lesions, ulcers. No induration Neurologic: Grossly intact and nonfocal  psychiatric: Normal judgment and insight. Alert and oriented x 3. Normal mood.    Impression and Plan:  Acute left-sided low back pain without sciatica -Check UA and culture to rule out UTI/hematuria that might be indicative of kidney stones. -Have advised icing, ibuprofen, back stretches, local massage therapy. -She will return in 2 to 3 weeks.    Patient Instructions  -Nice seeing you today!!  -Will call you with the results of your urine tests once available.  -Ice 2-3 times a day for 15 mins. Do back stretches twice daily.  -Schedule followup in 3 weeks if no improvement.     [name], MD [facility] at [facility]

## 2019-02-14 NOTE — Addendum Note (Signed)
Addended by: Elmer Picker on: 02/14/2019 03:04 PM   Modules accepted: Orders

## 2019-02-14 NOTE — Addendum Note (Signed)
Addended by: Westley Hummer B on: 02/14/2019 10:21 AM   Modules accepted: Orders

## 2019-02-14 NOTE — Patient Instructions (Addendum)
-  Nice seeing you today!!  -Will call you with the results of your urine tests once available.  -Ice 2-3 times a day for 15 mins. Do back stretches twice daily. Ibuprofen 2 tablets twice daily for 5-7 days if needed for pain relief. Massage therapy could be helpful.  -Schedule follow up in 3 weeks if no improvement.  -Schedule follow up at your convenience for a physical. Please come in fasting that day for lab work.

## 2019-02-16 LAB — URINE CULTURE
MICRO NUMBER:: 835396
Result:: NO GROWTH
SPECIMEN QUALITY:: ADEQUATE

## 2019-02-27 ENCOUNTER — Telehealth: Payer: Self-pay | Admitting: *Deleted

## 2019-02-27 DIAGNOSIS — R300 Dysuria: Secondary | ICD-10-CM

## 2019-02-27 NOTE — Telephone Encounter (Signed)
Patient called after hours line 02/25/2019. Patient c/ot she started having some pain and burning with urination a couple of hours ago. She feels like she has to go but then she doesn't go. She complains of constant pressure. She denies any fever, nausea, vomiting or diarrhea.

## 2019-02-28 ENCOUNTER — Telehealth: Payer: Self-pay | Admitting: *Deleted

## 2019-02-28 NOTE — Telephone Encounter (Signed)
Copied from [place] [id number]. Topic: General - Other >> Feb 28, 2019  1:12 PM Wynetta Emery, [place] C wrote: Reason for CRM: pt called in to be advised. Pt says that she had a urinalysis completed and is now having the same symptoms again. Pt would like to have another urinalysis completed. Pt says that the first one didn't show anything but she feels that this one will.    [id number] -- please assist.

## 2019-02-28 NOTE — Telephone Encounter (Signed)
Spoke with patient.  She is having burning and some low back pain.  She would like to know if she can drop off a urine sample?  Patient was seen 02/14/2019.

## 2019-03-01 ENCOUNTER — Other Ambulatory Visit (INDEPENDENT_AMBULATORY_CARE_PROVIDER_SITE_OTHER): Payer: BC Managed Care – PPO

## 2019-03-01 ENCOUNTER — Other Ambulatory Visit: Payer: Self-pay

## 2019-03-01 ENCOUNTER — Other Ambulatory Visit: Payer: Self-pay | Admitting: Internal Medicine

## 2019-03-01 DIAGNOSIS — R300 Dysuria: Secondary | ICD-10-CM | POA: Diagnosis not present

## 2019-03-01 DIAGNOSIS — N3000 Acute cystitis without hematuria: Secondary | ICD-10-CM

## 2019-03-01 LAB — URINALYSIS, ROUTINE W REFLEX MICROSCOPIC
Bilirubin Urine: NEGATIVE
Hgb urine dipstick: NEGATIVE
Ketones, ur: NEGATIVE
Nitrite: NEGATIVE
Specific Gravity, Urine: 1.015 (ref 1.000–1.030)
Total Protein, Urine: NEGATIVE
Urine Glucose: NEGATIVE
Urobilinogen, UA: 0.2 (ref 0.0–1.0)
pH: 8 (ref 5.0–8.0)

## 2019-03-01 MED ORDER — SULFAMETHOXAZOLE-TRIMETHOPRIM 800-160 MG PO TABS: 1.0000 | ORAL_TABLET | Freq: Two times a day (BID) | ORAL | 0 refills | Status: AC

## 2019-03-01 NOTE — Telephone Encounter (Signed)
Spoke with patient Lab order placed Lab appointment made

## 2019-03-01 NOTE — Telephone Encounter (Signed)
Yes

## 2019-03-08 ENCOUNTER — Ambulatory Visit (INDEPENDENT_AMBULATORY_CARE_PROVIDER_SITE_OTHER): Payer: BC Managed Care – PPO | Admitting: Family Medicine

## 2019-03-08 ENCOUNTER — Other Ambulatory Visit: Payer: Self-pay

## 2019-03-08 ENCOUNTER — Encounter: Payer: Self-pay | Admitting: Family Medicine

## 2019-03-08 VITALS — BP 112/80 | HR 58 | Temp 97.7°F | Ht 66.5 in | Wt 156.6 lb

## 2019-03-08 DIAGNOSIS — R21 Rash and other nonspecific skin eruption: Secondary | ICD-10-CM

## 2019-03-08 DIAGNOSIS — M545 Low back pain, unspecified: Secondary | ICD-10-CM

## 2019-03-08 LAB — POCT URINALYSIS DIPSTICK
Bilirubin, UA: NEGATIVE
Blood, UA: NEGATIVE
Glucose, UA: NEGATIVE
Ketones, UA: NEGATIVE
Leukocytes, UA: NEGATIVE
Nitrite, UA: NEGATIVE
Protein, UA: NEGATIVE
Spec Grav, UA: 1.015 (ref 1.010–1.025)
Urobilinogen, UA: 0.2 E.U./dL
pH, UA: 6 (ref 5.0–8.0)

## 2019-03-08 MED ORDER — TRIAMCINOLONE ACETONIDE 0.1 % EX CREA: 1.0000 "application " | TOPICAL_CREAM | Freq: Two times a day (BID) | CUTANEOUS | 1 refills | Status: AC

## 2019-03-08 NOTE — Patient Instructions (Signed)
Follow up for any fever or other concerns  Try the steroid cream twice daily as needed.    Let me know if rash not cleared in 2 weeks.

## 2019-03-08 NOTE — Progress Notes (Signed)
  Subjective:     Patient ID: Cindy Frost, female   DOB: Nov 02, 1960, 58 y.o.   MRN: [id number]  HPI   Patient is seen as a work in with bilateral low back pain for about 7 weeks now.  This was felt to be musculoskeletal at her initial visit.  She had urine culture done early September which came back negative.  She then came back in and repeat urinalysis done on the 16th that showed significant leukocytes.  She was treated empirically with Cipro.  She states her back pain did improve slightly.  Culture was not done the second time.    She has not had significant hematuria on microscopy with either urine.  No history of kidney stones.  Denies burning with urination.  She has dull pain somewhat left flank greater than right.  Occasionally worse with movement.  No radiculitis symptoms.  Denies any fevers or chills.  No night sweats.  No gross hematuria.  No abdominal pain.  No appetite or weight changes  Second issue is she states she has rash on her upper and lower extremities bilaterally.  These are slightly vesicular.  Not aware of any contact allergies.  Moderate pruritus.  No relief with over-the-counter medications.  Past Medical History:  Diagnosis Date  . SCC (squamous cell carcinoma) 11/28/2002   Left Side of Neck   Past Surgical History:  Procedure Laterality Date  . KNEE ARTHROSCOPY W/ LASER Left     reports that she has never smoked. She has never used smokeless tobacco. She reports current alcohol use. She reports that she does not use drugs. family history is not on file. Allergies  Allergen Reactions  . Amoxicillin     REACTION: hives     Review of Systems  Constitutional: Negative for appetite change, chills, fever and unexpected weight change.  Respiratory: Negative for shortness of breath.   Cardiovascular: Negative for chest pain.  Gastrointestinal: Negative for abdominal pain.  Genitourinary: Positive for flank pain. Negative for difficulty urinating, dysuria,  frequency and hematuria.  Musculoskeletal: Positive for back pain.       Objective:   Physical Exam Constitutional:      Appearance: Normal appearance.  Cardiovascular:     Rate and Rhythm: Normal rate and regular rhythm.  Pulmonary:     Effort: Pulmonary effort is normal.     Breath sounds: Normal breath sounds.  Musculoskeletal:     Comments: Minimal tenderness left lower back region bilaterally.  No spinal tenderness.  Skin:    Findings: Rash present.     Comments: She has a few very small vesicular lesions on her lower thighs bilaterally.  She has a few small crusted type lesions on her upper extremities  Neurological:     Mental Status: She is alert.        Assessment:     #1 low back pain which is somewhat bilateral for past 7 weeks.  Doubt related to urinary tract infection.  #2 diffuse skin rash.  This has more appearance of probable contact allergy and less likely eczematous    Plan:     -Recheck urine-completely clear with no leukocytes, nitrites, or blood -Triamcinolone 0.1% cream to use twice daily as needed.  We discussed systemic steroids such as prednisone but she would like to try to avoid at this time  Eulas Post MD [facility] at [facility]

## 2019-03-22 ENCOUNTER — Ambulatory Visit: Payer: BC Managed Care – PPO | Admitting: Internal Medicine

## 2019-03-22 ENCOUNTER — Ambulatory Visit: Payer: BC Managed Care – PPO | Admitting: Family Medicine

## 2019-06-06 DIAGNOSIS — Z1231 Encounter for screening mammogram for malignant neoplasm of breast: Secondary | ICD-10-CM | POA: Diagnosis not present

## 2019-06-06 DIAGNOSIS — Z01419 Encounter for gynecological examination (general) (routine) without abnormal findings: Secondary | ICD-10-CM | POA: Diagnosis not present

## 2019-06-06 DIAGNOSIS — Z6824 Body mass index (BMI) 24.0-24.9, adult: Secondary | ICD-10-CM | POA: Diagnosis not present

## 2019-07-30 DIAGNOSIS — Z03818 Encounter for observation for suspected exposure to other biological agents ruled out: Secondary | ICD-10-CM | POA: Diagnosis not present

## 2019-07-30 DIAGNOSIS — Z20828 Contact with and (suspected) exposure to other viral communicable diseases: Secondary | ICD-10-CM | POA: Diagnosis not present

## 2019-07-30 DIAGNOSIS — M791 Myalgia, unspecified site: Secondary | ICD-10-CM | POA: Diagnosis not present

## 2019-09-03 DIAGNOSIS — R300 Dysuria: Secondary | ICD-10-CM | POA: Diagnosis not present

## 2019-10-26 ENCOUNTER — Institutional Professional Consult (permissible substitution): Payer: Self-pay | Admitting: Neurology

## 2019-10-30 DIAGNOSIS — Z13820 Encounter for screening for osteoporosis: Secondary | ICD-10-CM | POA: Diagnosis not present

## 2019-11-02 DIAGNOSIS — Z6823 Body mass index (BMI) 23.0-23.9, adult: Secondary | ICD-10-CM | POA: Diagnosis not present

## 2019-11-02 DIAGNOSIS — M542 Cervicalgia: Secondary | ICD-10-CM | POA: Diagnosis not present

## 2019-11-09 ENCOUNTER — Other Ambulatory Visit: Payer: Self-pay

## 2019-11-09 DIAGNOSIS — L255 Unspecified contact dermatitis due to plants, except food: Secondary | ICD-10-CM | POA: Diagnosis not present

## 2019-11-10 ENCOUNTER — Ambulatory Visit: Payer: BC Managed Care – PPO | Admitting: Internal Medicine

## 2020-02-23 DIAGNOSIS — S161XXA Strain of muscle, fascia and tendon at neck level, initial encounter: Secondary | ICD-10-CM | POA: Diagnosis not present

## 2020-02-23 DIAGNOSIS — M542 Cervicalgia: Secondary | ICD-10-CM | POA: Diagnosis not present

## 2020-03-09 IMAGING — MG DIGITAL DIAGNOSTIC UNILATERAL LEFT MAMMOGRAM WITH TOMO AND CAD
4 series · 4 of 12 positions shown · non-contrast
Comparison: Previous exam(s).

CLINICAL DATA: Screening recall for a possible left breast mass.

EXAM:
DIGITAL DIAGNOSTIC LEFT MAMMOGRAM WITH CAD AND TOMO
ULTRASOUND LEFT BREAST

[L MLO synth-2D]
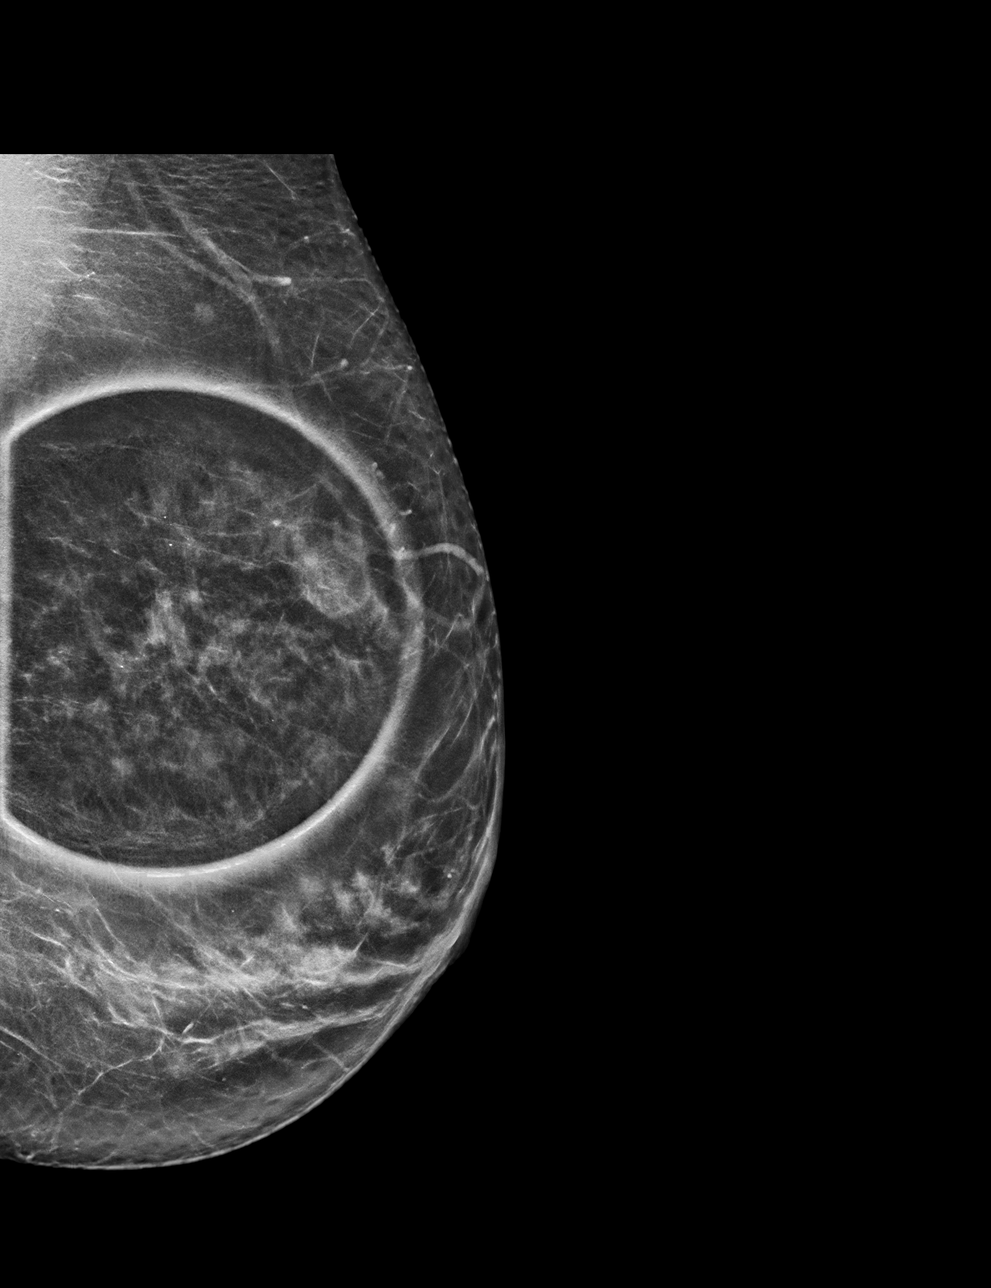

[L CC synth-2D]
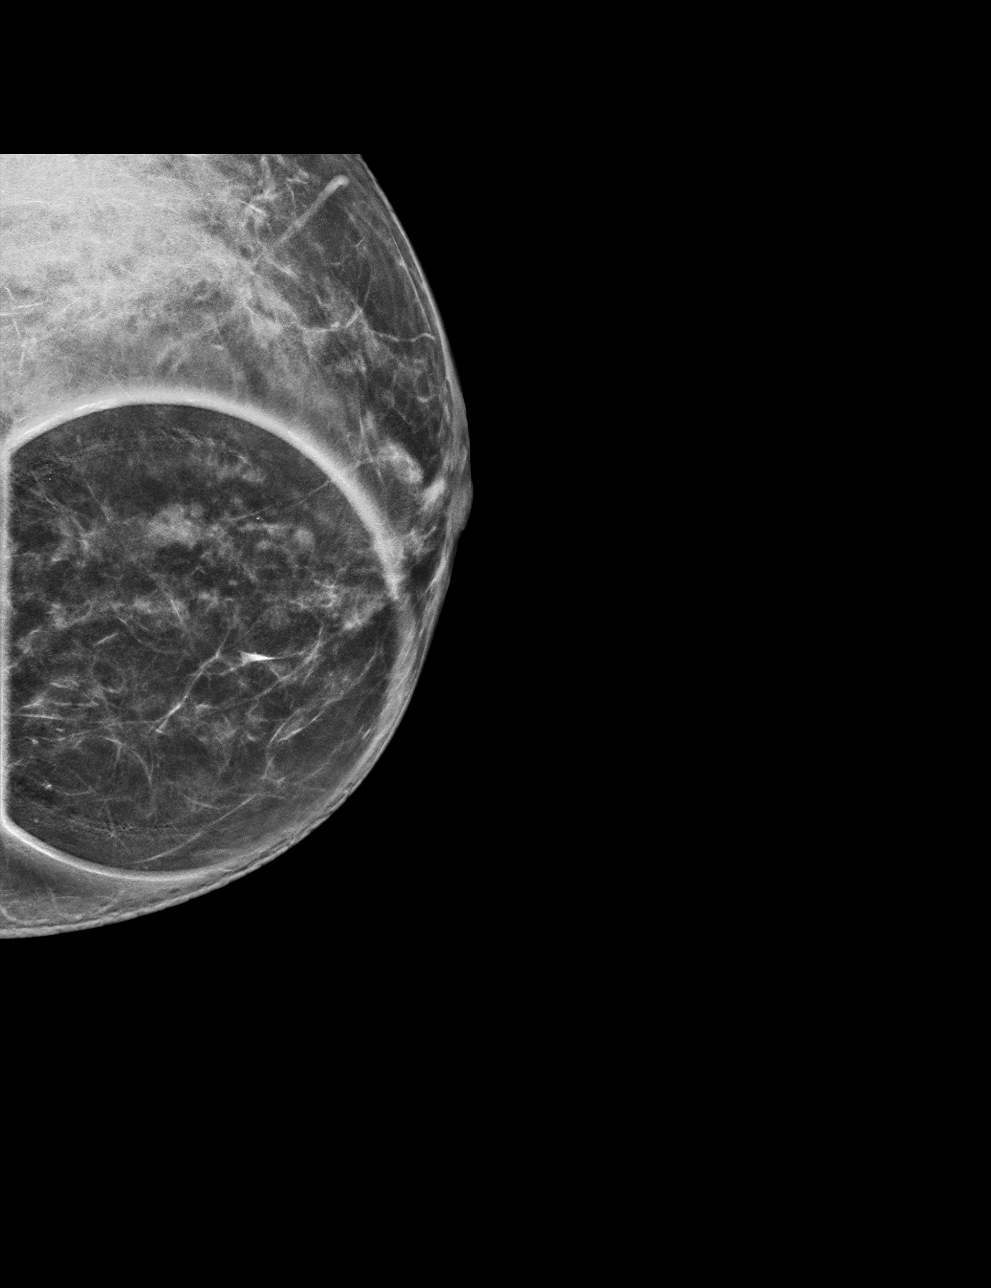

[L CC tomo · tomo slice 27/52.0]
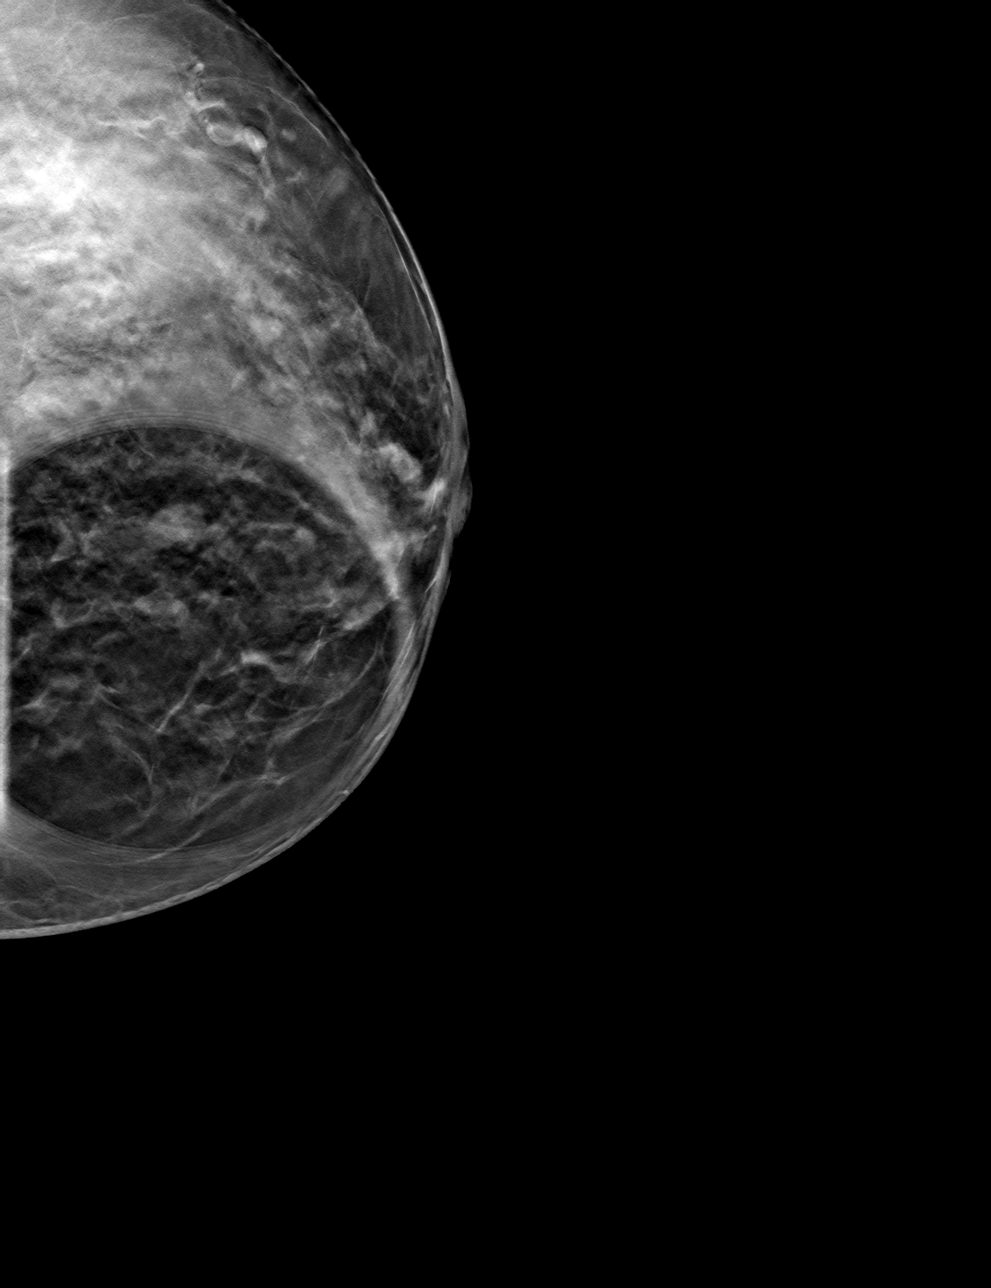

[L MLO tomo · tomo slice 33/64.0]
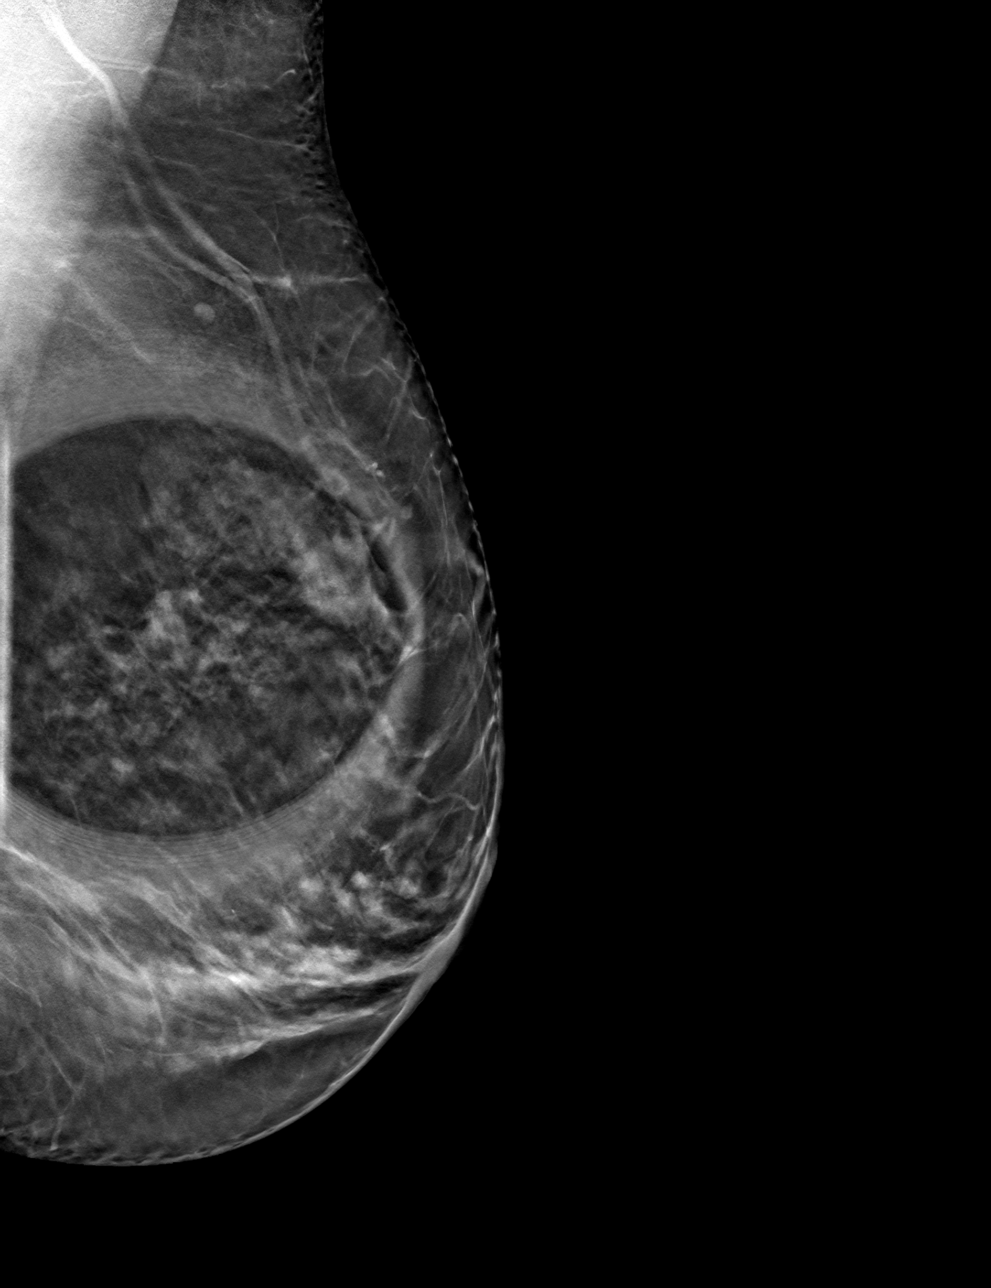

[4 of 12 positions shown; findings below may reference images not displayed]

ACR Breast Density Category c: The breast tissue is heterogeneously
dense, which may obscure small masses.
FINDINGS: There is a persistent mass demonstrated on the spot compression
tomosynthesis images in the upper slightly inner left breast, middle
depth.

Mammographic images were processed with CAD.

Ultrasound of the left breast at 12 o'clock, 4 cm from the nipple
demonstrates a circumscribed oval anechoic mass measuring 0.9 x
x 0.8 cm.
IMPRESSION: 1.  The left breast mass corresponds with a benign cyst.

RECOMMENDATION:
Screening mammogram in one year.(Code:LA-B-OVW)

I have discussed the findings and recommendations with the patient.
Results were also provided in writing at the conclusion of the
visit. If applicable, a reminder letter will be sent to the patient
regarding the next appointment.

BI-RADS CATEGORY  2: Benign.

## 2020-03-09 IMAGING — US ULTRASOUND LEFT BREAST LIMITED
1 series · 7 of 7 positions shown · non-contrast
Comparison: Previous exam(s).

CLINICAL DATA: Screening recall for a possible left breast mass.

EXAM:
DIGITAL DIAGNOSTIC LEFT MAMMOGRAM WITH CAD AND TOMO
ULTRASOUND LEFT BREAST

[Series 1: ultrasound left breast limited · 0.06mm/px · 7 of 7 slices shown]
[im 1/7]
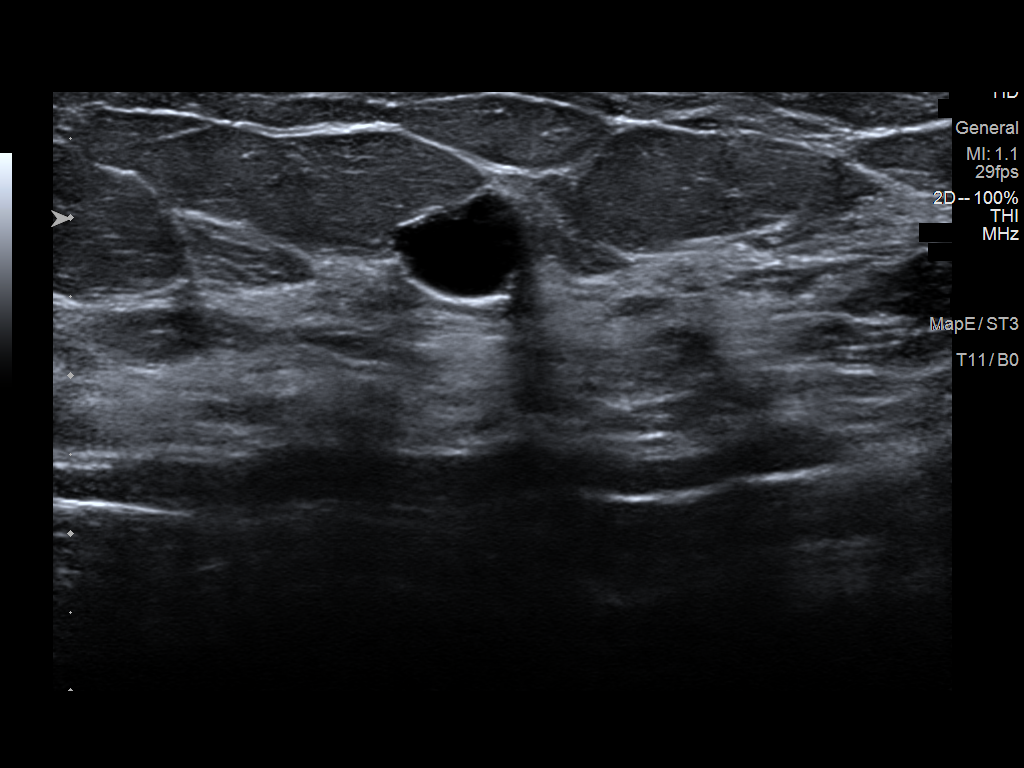
[im 2/7]
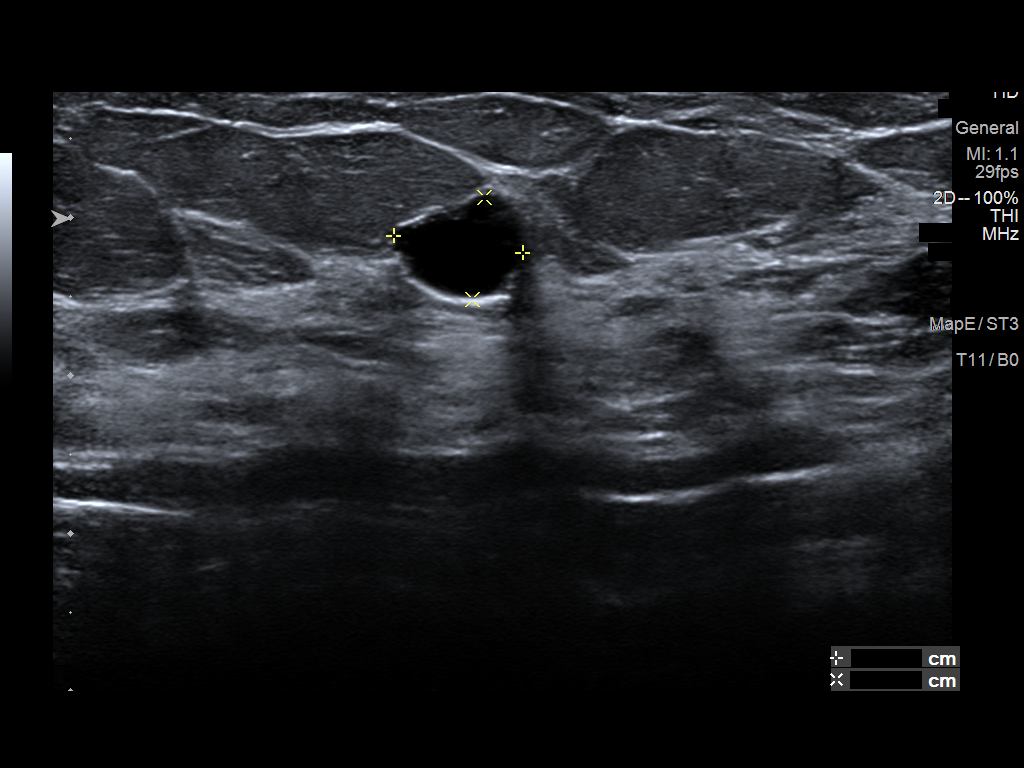
[im 3/7]
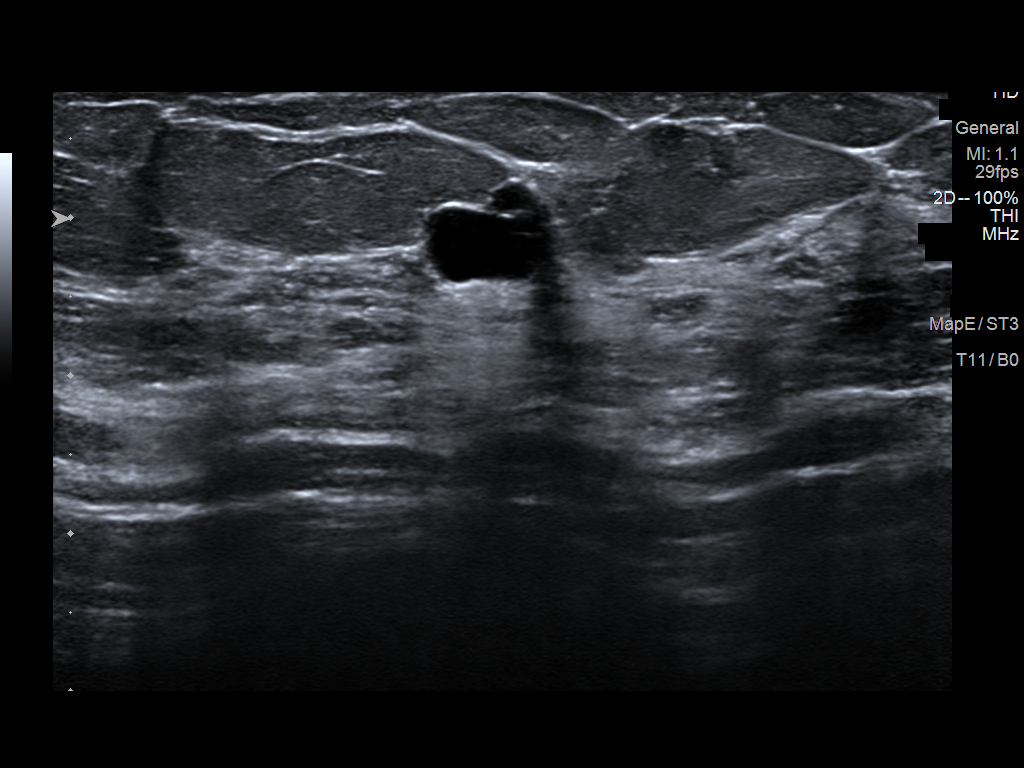
[im 4/7]
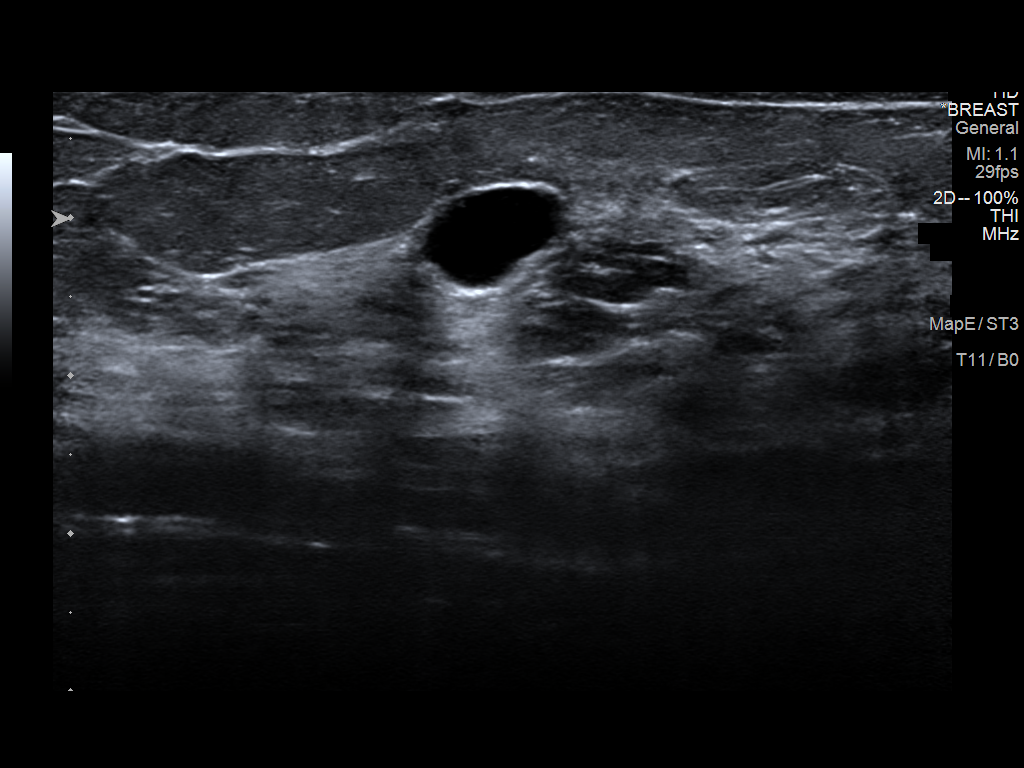
[im 5/7]
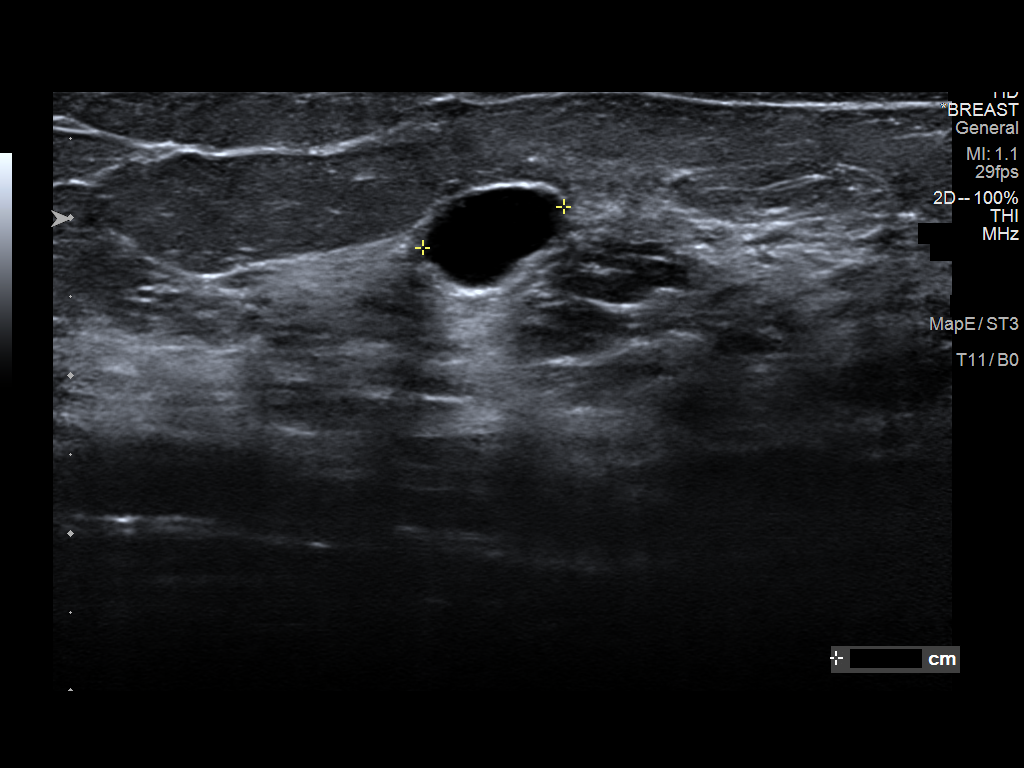
[im 6/7]
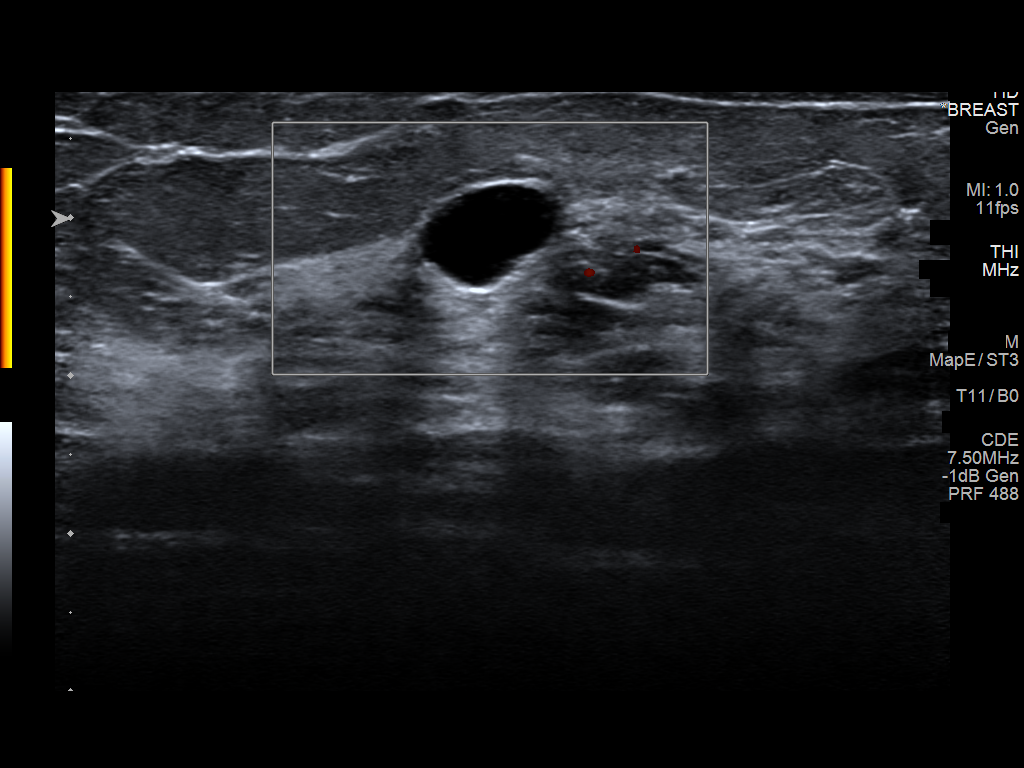
[im 7/7]
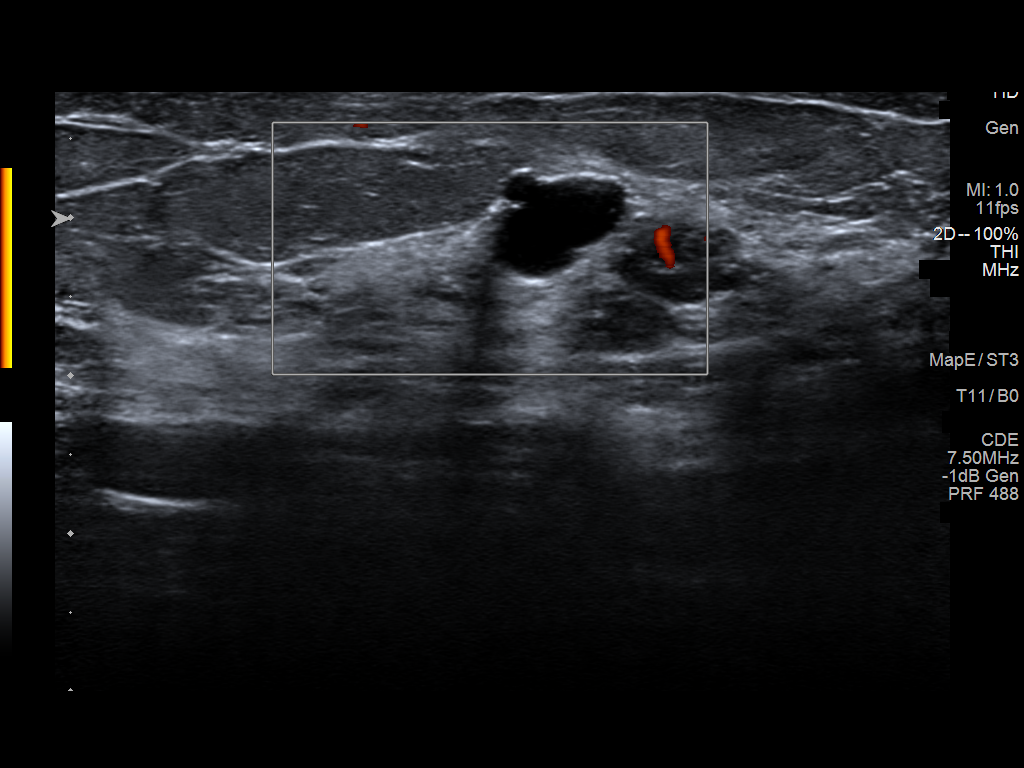

[7 of 7 positions shown; findings below may reference images not displayed]

ACR Breast Density Category c: The breast tissue is heterogeneously
dense, which may obscure small masses.
FINDINGS: There is a persistent mass demonstrated on the spot compression
tomosynthesis images in the upper slightly inner left breast, middle
depth.

Mammographic images were processed with CAD.

Ultrasound of the left breast at 12 o'clock, 4 cm from the nipple
demonstrates a circumscribed oval anechoic mass measuring 0.9 x
x 0.8 cm.
IMPRESSION: 1.  The left breast mass corresponds with a benign cyst.

RECOMMENDATION:
Screening mammogram in one year.(Code:LA-B-OVW)

I have discussed the findings and recommendations with the patient.
Results were also provided in writing at the conclusion of the
visit. If applicable, a reminder letter will be sent to the patient
regarding the next appointment.

BI-RADS CATEGORY  2: Benign.

## 2020-04-08 ENCOUNTER — Encounter: Payer: Self-pay | Admitting: Family Medicine

## 2020-04-08 ENCOUNTER — Ambulatory Visit (INDEPENDENT_AMBULATORY_CARE_PROVIDER_SITE_OTHER): Payer: BC Managed Care – PPO | Admitting: Family Medicine

## 2020-04-08 ENCOUNTER — Ambulatory Visit (INDEPENDENT_AMBULATORY_CARE_PROVIDER_SITE_OTHER): Payer: BC Managed Care – PPO

## 2020-04-08 ENCOUNTER — Other Ambulatory Visit: Payer: Self-pay

## 2020-04-08 VITALS — BP 100/76 | HR 77 | Temp 98.1°F | Wt 161.6 lb

## 2020-04-08 DIAGNOSIS — G8929 Other chronic pain: Secondary | ICD-10-CM

## 2020-04-08 DIAGNOSIS — R109 Unspecified abdominal pain: Secondary | ICD-10-CM

## 2020-04-08 NOTE — Patient Instructions (Signed)
Flank Pain, Adult Flank pain is pain that is located on the side of the body between the upper abdomen and the back. This area is called the flank. The pain may occur over a short period of time (acute), or it may be long-term or recurring (chronic). It may be mild or severe. Flank pain can be caused by many things, including:  Muscle soreness or injury.  Kidney stones or kidney disease.  Stress.  A disease of the spine (vertebral disk disease).  A lung infection (pneumonia).  Fluid around the lungs (pulmonary edema).  A skin rash caused by the chickenpox virus (shingles).  Tumors that affect the back of the abdomen.  Gallbladder disease. Follow these instructions at home:   Drink enough fluid to keep your urine clear or pale yellow.  Rest as told by your health care provider.  Take over-the-counter and prescription medicines only as told by your health care provider.  Keep a journal to track what has caused your flank pain and what has made it feel better.  Keep all follow-up visits as told by your health care provider. This is important. Contact a health care provider if:  Your pain is not controlled with medicine.  You have new symptoms.  Your pain gets worse.  You have a fever.  Your symptoms last longer than 2-3 days.  You have trouble urinating or you are urinating very frequently. Get help right away if:  You have trouble breathing or you are short of breath.  Your abdomen hurts or it is swollen or red.  You have nausea or vomiting.  You feel faint or you pass out.  You have blood in your urine. Summary  Flank pain is pain that is located on the side of the body between the upper abdomen and the back.  The pain may occur over a short period of time (acute), or it may be long-term or recurring (chronic). It may be mild or severe.  Flank pain can be caused by many things.  Contact your health care provider if your symptoms get worse or they last  longer than 2-3 days. This information is not intended to replace advice given to you by your health care provider. Make sure you discuss any questions you have with your health care provider. Document Revised: 05/14/2017 Document Reviewed: 08/14/2016 Elsevier Patient Education  2020 Elsevier Inc.  

## 2020-04-08 NOTE — Progress Notes (Signed)
Subjective:    Patient ID: Cindy Frost, female    DOB: 1960/06/16, 59 y.o.   MRN: [id number]  No chief complaint on file.   HPI Pt is a 59 yo female with pmh sig for allergies and eczema followed by Dr. Jerilee Hoh who was seen for a chronic concern.  Patient endorses right rib/flank pain s/p fall landing on a rock while running over 1 year ago.  Pt was not evaluated for this.  Pt states the area was sore then slowly improved.  Over the last few weeks the area has become painful with a sharp sensation.  Pt noticed mild edema.  Patient denies back pain, nausea, vomiting, dysuria, breast pain.  Pt is concerned she broke her rib and insist on x-ray.  Pt tried Advil and aspirin for symptoms.  Pt denies recent injury, pain that moves, cough, n/v, abdominal pain, urinary symptoms.  Past Medical History:  Diagnosis Date  . SCC (squamous cell carcinoma) 11/28/2002   Left Side of Neck    Allergies  Allergen Reactions  . Amoxicillin     REACTION: hives    ROS General: Denies fever, chills, night sweats, changes in weight, changes in appetite HEENT: Denies headaches, ear pain, changes in vision, rhinorrhea, sore throat CV: Denies CP, palpitations, SOB, orthopnea Pulm: Denies SOB, cough, wheezing GI: Denies abdominal pain, nausea, vomiting, diarrhea, constipation GU: Denies dysuria, hematuria, frequency, vaginal discharge Msk: Denies muscle cramps, joint pains  +R rib/flank pain Neuro: Denies weakness, numbness, tingling Skin: Denies rashes, bruising Psych: Denies depression, anxiety, hallucinations     Objective:    Blood pressure 100/76, pulse 77, temperature 98.1 F (36.7 C), temperature source Oral, weight 161 lb 9.6 oz (73.3 kg), SpO2 98 %.  Gen. Pleasant, well-nourished, in no distress, normal affect   HEENT: [place]/AT, face symmetric, conjunctiva clear, no scleral icterus, PERRLA, EOMI, nares patent without drainage Lungs: no accessory muscle use, CTAB, no wheezes or rales Cardiovascular:  RRR, no m/r/g, no peripheral edema Abdomen: BS present, soft, NT/ND, no hepatosplenomegaly.  No CVA tenderness Musculoskeletal: TTP of upper R lateral rib/flank proximal to R axilla.  No TTP of cervical, thoracic, or lumbar spine, or paraspinal muscles.  No deformities, no cyanosis or clubbing, normal tone Neuro:  A&Ox3, CN II-XII intact, normal gait Skin:  Warm, no lesions/ rash.  No erythema or edema of b/l flanks.   Wt Readings from Last 3 Encounters:  04/08/20 161 lb 9.6 oz (73.3 kg)  03/08/19 156 lb 9.6 oz (71 kg)  02/14/19 154 lb 14.4 oz (70.3 kg)    Lab Results  Component Value Date   WBC 5.1 10/15/2009   HGB 13.0 10/15/2009   HCT 37.4 10/15/2009   PLT 286.0 10/15/2009   GLUCOSE 70 10/15/2009   CHOL 174 10/15/2009   TRIG 102.0 10/15/2009   HDL 39.30 10/15/2009   LDLCALC 114 (H) 10/15/2009   ALT 14 10/15/2009   AST 19 10/15/2009   NA 141 10/15/2009   K 4.7 10/15/2009   CL 107 10/15/2009   CREATININE 0.8 10/15/2009   BUN 7 10/15/2009   CO2 29 10/15/2009   TSH 0.52 10/15/2009    Assessment/Plan:  Right flank pain, chronic  -R lateral rib/flank pain x 1 yr, recently increased -discussed possible causes including intercostal nerve entrapment s/p fx, muscle strain, pna, referred pain from gallbladder etiology. -Continue supportive care: NSAIDs, heat, massage, stretching -Discussed rib fractures and healing process as initial injury was over 1 year ago. -Given handout - Plan: DG Chest  2 View  F/u prn with pcp.  Grier Mitts, MD

## 2021-05-29 ENCOUNTER — Telehealth: Payer: Self-pay | Admitting: Internal Medicine

## 2021-05-29 NOTE — Telephone Encounter (Signed)
Called patient and tried to schedule an appointment for her to be seen by Dr.Hernandez due to her last  visit being in 2021.

## 2021-07-21 ENCOUNTER — Encounter: Payer: Self-pay | Admitting: Dermatology

## 2021-07-21 ENCOUNTER — Other Ambulatory Visit: Payer: Self-pay

## 2021-07-21 ENCOUNTER — Ambulatory Visit (INDEPENDENT_AMBULATORY_CARE_PROVIDER_SITE_OTHER): Payer: Managed Care, Other (non HMO) | Admitting: Dermatology

## 2021-07-21 DIAGNOSIS — L570 Actinic keratosis: Secondary | ICD-10-CM

## 2021-07-21 DIAGNOSIS — Z85828 Personal history of other malignant neoplasm of skin: Secondary | ICD-10-CM | POA: Diagnosis not present

## 2021-07-21 DIAGNOSIS — Z1283 Encounter for screening for malignant neoplasm of skin: Secondary | ICD-10-CM | POA: Diagnosis not present

## 2021-07-21 MED ORDER — IMIQUIMOD 5 % EX CREA: TOPICAL_CREAM | Freq: Every day | CUTANEOUS | 0 refills | Status: AC

## 2021-07-21 NOTE — Patient Instructions (Signed)
Imiquimod cream nose Monday Wednesday Friday x 6 weeks unless you get inflammation before then.

## 2021-07-23 ENCOUNTER — Ambulatory Visit: Payer: BC Managed Care – PPO | Admitting: Dermatology

## 2021-08-09 NOTE — Progress Notes (Signed)
° °  Follow-Up Visit   Subjective  Cindy Frost is a 61 y.o. female who presents for the following: Annual Exam (Lesion on left side nose x 2 years. Seems to be getting darker. Personal history of scc. ).  Annual skin examination Left lower nose Location:  Duration:  Quality:  Associated Signs/Symptoms: Modifying Factors:  Severity:  Timing: Context:   Objective  Well appearing patient in no apparent distress; mood and affect are within normal limits. Status post areas and back examined, no atypical pigmented lesions.  Left Tip of Nose Subtle pink-tan 3 mm crust compatible with pigmented AK    All sun exposed areas plus back examined.   Assessment & Plan    AK (actinic keratosis) Left Tip of Nose  We will try topical Aldara Monday Wednesday Friday for 6 weeks to minimize potential dyschromia from freezing.  If this fails, return for possible biopsy.  imiquimod (ALDARA) 5 % cream - Left Tip of Nose Apply topically at bedtime.  Encounter for screening for malignant neoplasm of skin  Annual skin examination, encouraged to self examine twice annually      I, Lavonna Monarch, MD, have reviewed all documentation for this visit.  The documentation on 08/09/21 for the exam, diagnosis, procedures, and orders are all accurate and complete.

## 2021-10-13 ENCOUNTER — Ambulatory Visit: Payer: Managed Care, Other (non HMO) | Admitting: Dermatology

## 2021-12-31 ENCOUNTER — Ambulatory Visit (INDEPENDENT_AMBULATORY_CARE_PROVIDER_SITE_OTHER): Payer: Managed Care, Other (non HMO) | Admitting: Dermatology

## 2021-12-31 DIAGNOSIS — L570 Actinic keratosis: Secondary | ICD-10-CM | POA: Diagnosis not present

## 2022-01-23 ENCOUNTER — Encounter: Payer: Self-pay | Admitting: Dermatology

## 2022-01-23 NOTE — Progress Notes (Signed)
   Follow-Up Visit   Subjective  Cindy Frost is a 61 y.o. female who presents for the following: Follow-up (Here for follow up on nose. Used the imiquimod cream x 2 treatments. ).  Follow-up crust on nose Location:  Duration:  Quality:  Associated Signs/Symptoms: Modifying Factors:  Severity:  Timing: Context:   Objective  Well appearing patient in no apparent distress; mood and affect are within normal limits. Mid Tip of Nose Slight residual erythema, no scale or crust or palpability    A focused examination was performed including the neck. Relevant physical exam findings are noted in the Assessment and Plan.   Assessment & Plan    AK (actinic keratosis) Mid Tip of Nose  Patient did imiquimod treatment. No treatment needed at this time today.  Check if there is clinical change  Related Medications imiquimod (ALDARA) 5 % cream Apply topically at bedtime.      I, Lavonna Monarch, MD, have reviewed all documentation for this visit.  The documentation on 01/23/22 for the exam, diagnosis, procedures, and orders are all accurate and complete.

## 2022-02-18 IMAGING — DX DG CHEST 2V
2 series · 2 of 2 positions shown · non-contrast
Comparison: Radiograph 07/05/2017

CLINICAL DATA: Chronic right flank pain for 1 year. Pain after fall
on a rock.

EXAM:
CHEST - 2 VIEW

[chest pa]
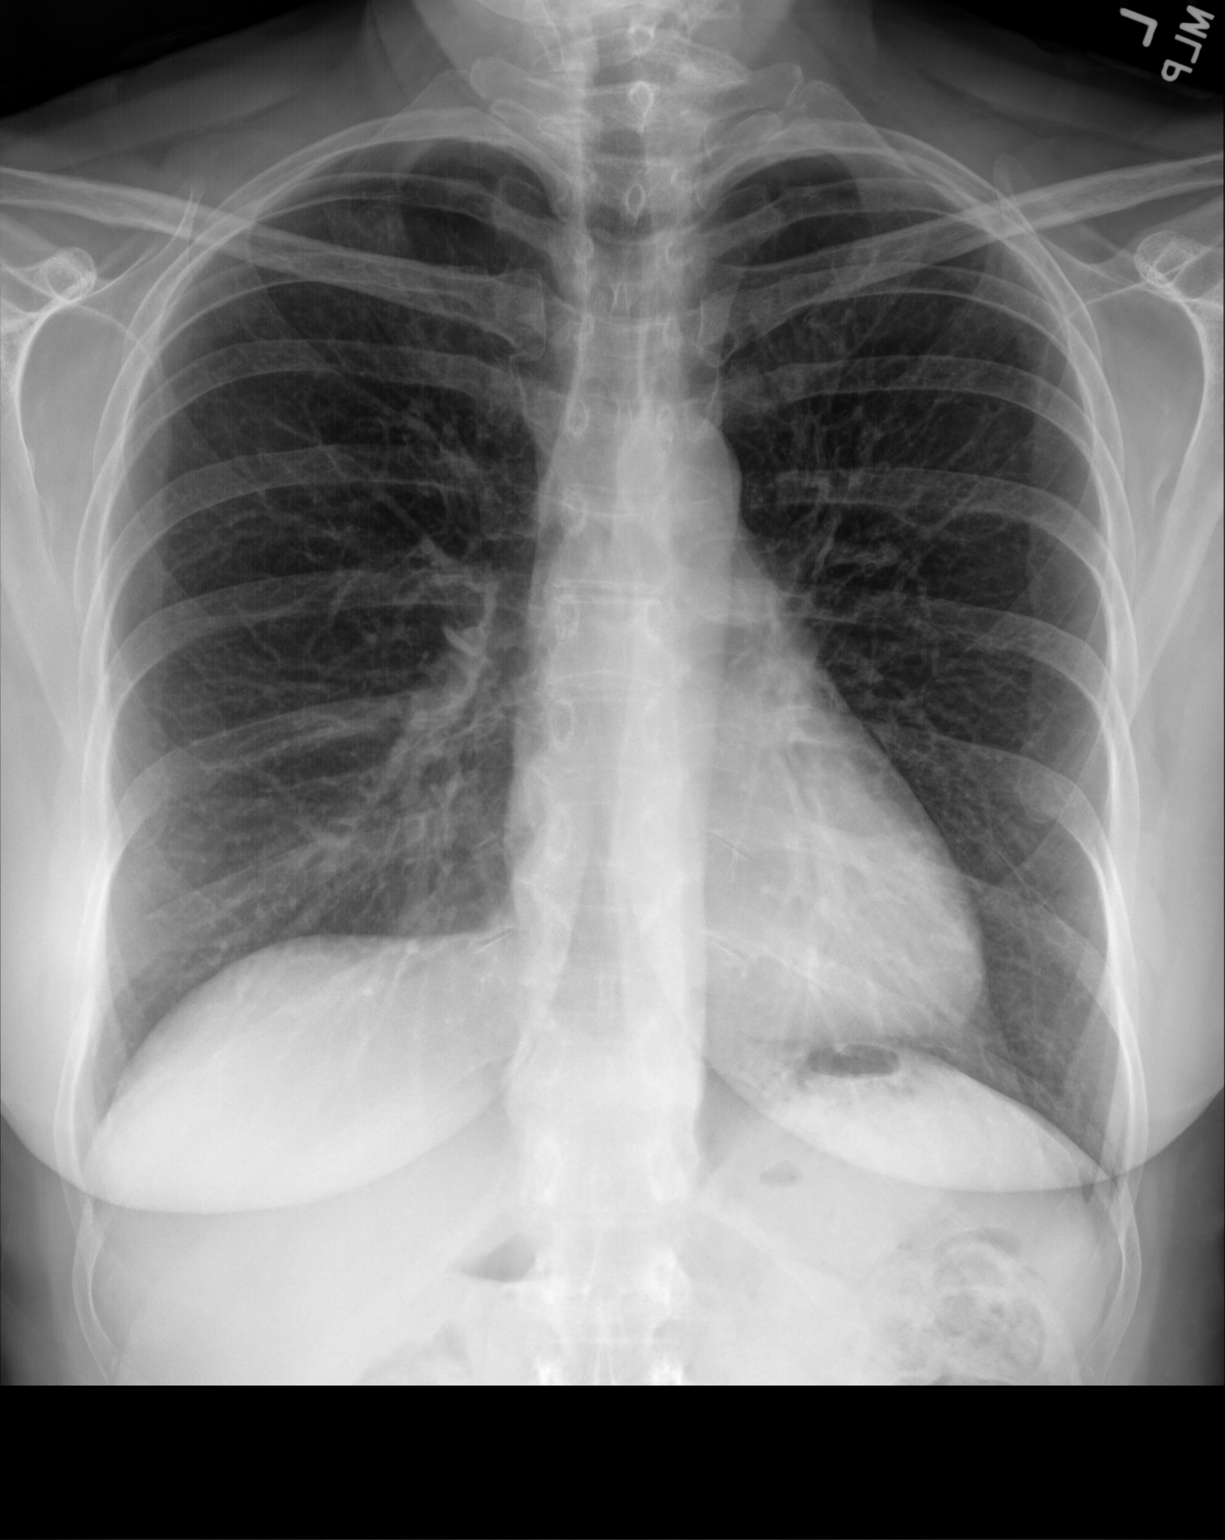

[chest lat]
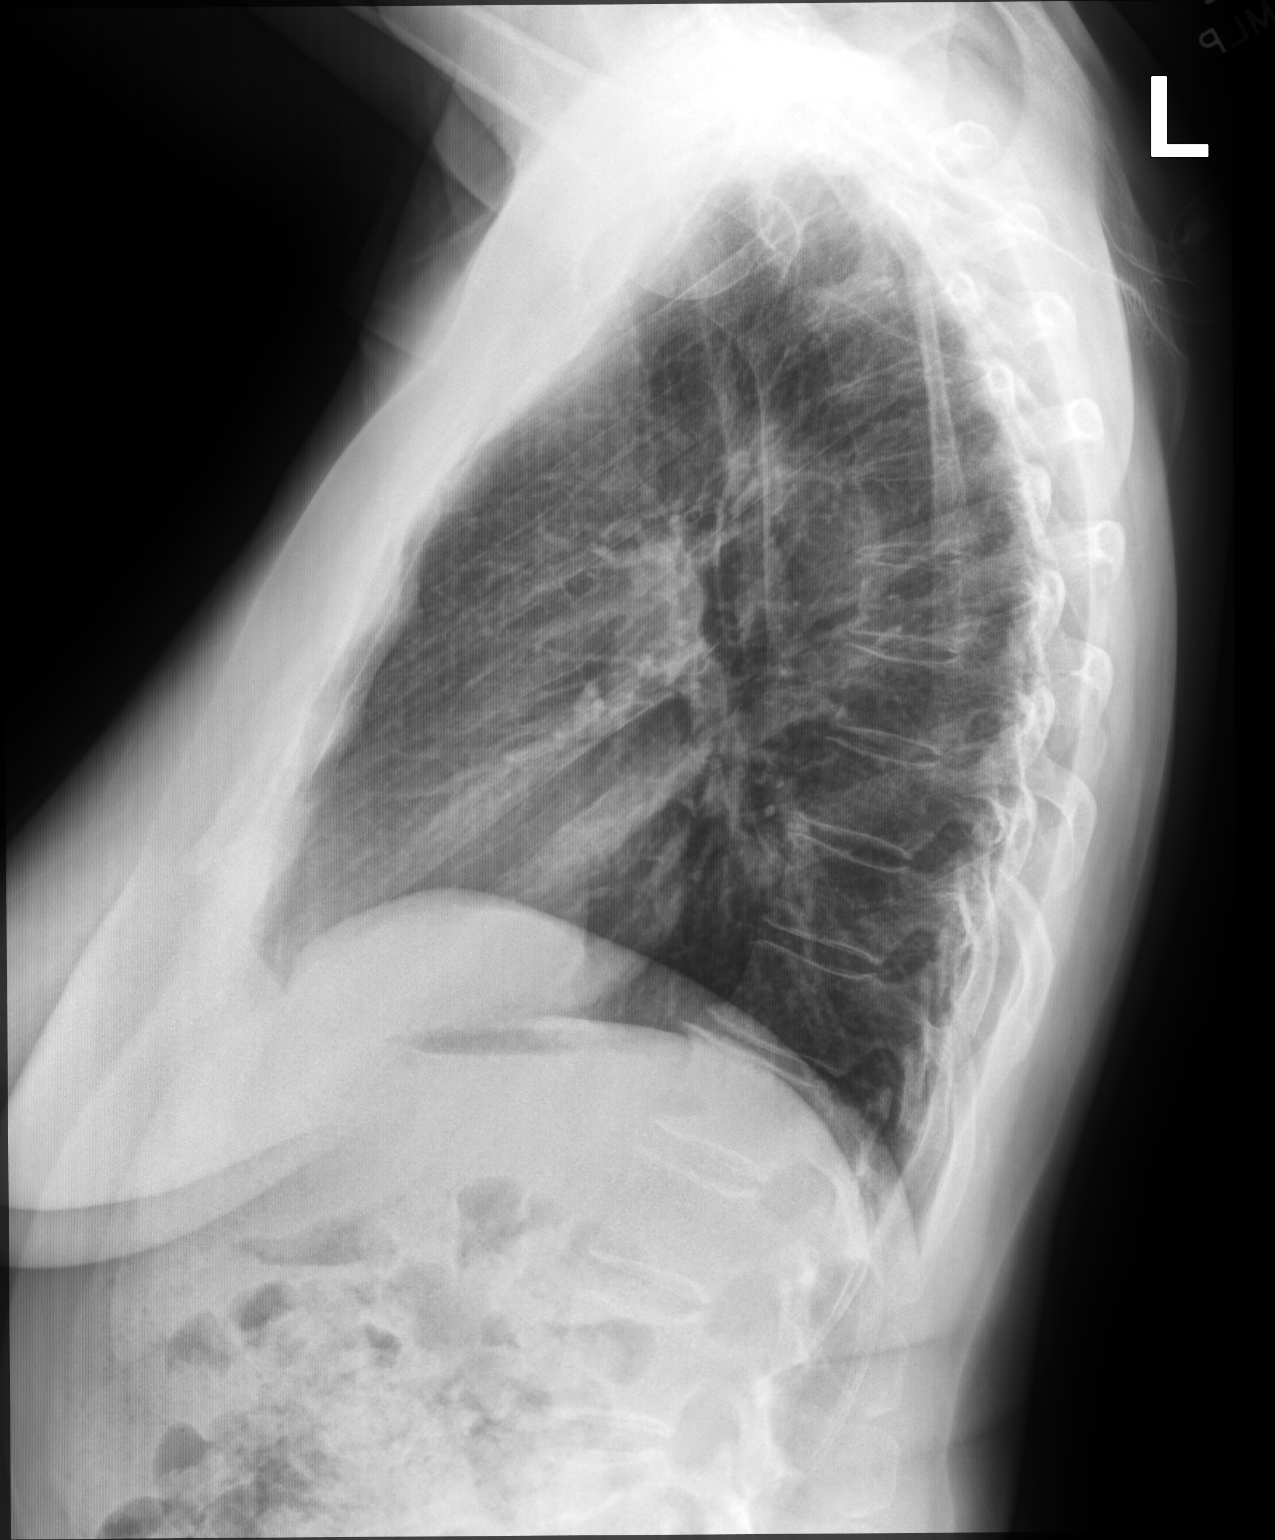

[2 of 2 positions shown; findings below may reference images not displayed]

FINDINGS: The cardiomediastinal contours are normal. The lungs are clear.
Pulmonary vasculature is normal. No consolidation, pleural effusion,
or pneumothorax. No acute osseous abnormalities are seen. No
evidence of rib fracture or rib abnormality to explain flank pain.
Normal bowel gas pattern in the included upper abdomen.
IMPRESSION: Negative radiographs of the chest. No explanation for right flank
pain.
